# Patient Record
Sex: Male | Born: 1970
Health system: Southern US, Community
[De-identification: ages and names within clinical notes are randomized; demographics above are authoritative.]

## PROBLEM LIST (undated history)

## (undated) DIAGNOSIS — J3489 Other specified disorders of nose and nasal sinuses: Secondary | ICD-10-CM

## (undated) DIAGNOSIS — Z889 Allergy status to unspecified drugs, medicaments and biological substances status: Secondary | ICD-10-CM

## (undated) DIAGNOSIS — K219 Gastro-esophageal reflux disease without esophagitis: Secondary | ICD-10-CM

## (undated) HISTORY — DX: Gastro-esophageal reflux disease without esophagitis: K21.9

---

## 2006-07-18 ENCOUNTER — Emergency Department (HOSPITAL_COMMUNITY): Admission: EM | Admit: 2006-07-18 | Discharge: 2006-07-18 | Payer: Self-pay | Admitting: Emergency Medicine

## 2007-09-22 ENCOUNTER — Emergency Department (HOSPITAL_COMMUNITY): Admission: EM | Admit: 2007-09-22 | Discharge: 2007-09-22 | Payer: Self-pay | Admitting: Emergency Medicine

## 2007-09-22 ENCOUNTER — Encounter: Payer: Self-pay | Admitting: Orthopedic Surgery

## 2007-09-23 ENCOUNTER — Encounter: Payer: Self-pay | Admitting: Orthopedic Surgery

## 2007-09-24 ENCOUNTER — Ambulatory Visit: Payer: Self-pay | Admitting: Orthopedic Surgery

## 2007-09-24 DIAGNOSIS — S93409A Sprain of unspecified ligament of unspecified ankle, initial encounter: Secondary | ICD-10-CM | POA: Insufficient documentation

## 2008-09-10 ENCOUNTER — Observation Stay (HOSPITAL_COMMUNITY): Admission: EM | Admit: 2008-09-10 | Discharge: 2008-09-11 | Payer: Self-pay | Admitting: Emergency Medicine

## 2008-09-11 ENCOUNTER — Encounter (INDEPENDENT_AMBULATORY_CARE_PROVIDER_SITE_OTHER): Payer: Self-pay | Admitting: Family Medicine

## 2010-09-07 LAB — BASIC METABOLIC PANEL
BUN: 11 mg/dL (ref 6–23)
BUN: 13 mg/dL (ref 6–23)
CO2: 23 mEq/L (ref 19–32)
Calcium: 8.8 mg/dL (ref 8.4–10.5)
Chloride: 108 mEq/L (ref 96–112)
Creatinine, Ser: 0.81 mg/dL (ref 0.4–1.5)
GFR calc Af Amer: >60 mL/min (ref >60)
Glucose, Bld: 111 mg/dL — ABNORMAL HIGH (ref 70–99)
Potassium: 3.8 mEq/L (ref 3.5–5.1)

## 2010-09-07 LAB — DIFFERENTIAL
Basophils Absolute: 0.1 10*3/uL (ref 0.0–0.1)
Basophils Relative: 1 % (ref 0–1)
Eosinophils Relative: 1 % (ref 0–5)
Lymphocytes Relative: 22 % (ref 12–46)
Lymphs Abs: 2.2 10*3/uL (ref 0.7–4.0)
Monocytes Absolute: 0.7 10*3/uL (ref 0.1–1.0)
Monocytes Relative: 7 % (ref 3–12)
Neutro Abs: 9.6 10*3/uL — ABNORMAL HIGH (ref 1.7–7.7)
Neutrophils Relative %: 79 % — ABNORMAL HIGH (ref 43–77)

## 2010-09-07 LAB — CARDIAC PANEL(CRET KIN+CKTOT+MB+TROPI)
CK, MB: 0.6 ng/mL (ref 0.3–4.0)
Relative Index: INVALID (ref 0.0–2.5)
Troponin I: 0.03 ng/mL (ref 0.00–0.06)

## 2010-09-07 LAB — CBC
HCT: 38 % — ABNORMAL LOW (ref 39.0–52.0)
Hemoglobin: 13.1 g/dL (ref 13.0–17.0)
MCHC: 34.8 g/dL (ref 30.0–36.0)
Platelets: 315 10*3/uL (ref 150–400)
RBC: 4.15 MIL/uL — ABNORMAL LOW (ref 4.22–5.81)
RDW: 13.4 % (ref 11.5–15.5)
WBC: 10.4 10*3/uL (ref 4.0–10.5)

## 2010-09-07 LAB — TSH: TSH: 2.594 u[IU]/mL (ref 0.350–4.500)

## 2010-09-07 LAB — LIPID PANEL: HDL: 24 mg/dL — ABNORMAL LOW (ref >39)

## 2010-09-07 LAB — POCT CARDIAC MARKERS
CKMB, poc: <1 ng/mL — ABNORMAL LOW (ref 1.0–8.0)
Myoglobin, poc: 56 ng/mL (ref 12–200)

## 2010-09-07 LAB — PROTIME-INR
INR: 1 (ref 0.00–1.49)
Prothrombin Time: 13.2 seconds (ref 11.6–15.2)

## 2010-09-07 LAB — BRAIN NATRIURETIC PEPTIDE: Pro B Natriuretic peptide (BNP): <30 pg/mL (ref 0.0–100.0)

## 2010-09-07 LAB — HOMOCYSTEINE: Homocysteine: 8.4 umol/L (ref 4.0–15.4)

## 2010-10-11 NOTE — H&P (Signed)
Walter Griffin, Walter Griffin              ACCOUNT NO.:  [id number]   MEDICAL RECORD NO.:  [id number]          PATIENT TYPE:  INP   LOCATION:  A317                          FACILITY:  [facility]   PHYSICIAN:  Dorris Singh, DO    DATE OF BIRTH:  [birth year]   DATE OF ADMISSION:  09/10/2008  DATE OF DISCHARGE:  LH                              HISTORY & PHYSICAL   CHIEF COMPLAINT:  Chest pain.   PRIMARY CARE PHYSICIAN:  Madelin Rear. Sherwood Gambler, MD   Mr. Snellgrove is a 40 year old Caucasian male who presented to the [facility] emergency room due to chief complaint of chest pain.  He stated  that he was outside mowing the lawn today and he became diaphoretic.  He  had chest pain and weakness all at once, and shortness of breath that  lasted for about 30 minutes.  He contacted a few of his friends, who  then brought him to the emergency room.  He was given sublingual  nitroglycerin and eventually the pain went away.  This is the first time  he had ever had a situation like this.  He is on metoprolol for  palpitations, but does have an extensive family heart history.  Nothing  made it worse, nitroglycerin made it better.  The patient admits to his pain was an 8/10.  The first nitroglycerin  brought it to 5/10, and the second nitroglycerin brought it down to  0/10.  He did have this a year ago, but the etiology was unknown and the  history of his racing heart etiology was unknown as well.   PAST MEDICAL HISTORY:  Significant for palpitations.   PAST SURGICAL HISTORY:  None.   SOCIAL HISTORY:  The patient is an occasional drinker.  He does use  snuff, but denies any drug use.  The patient is an EMS.   ALLERGIES:  He has no known drug allergies.   MEDICATIONS:  He is currently on metoprolol tartrate, I do not have a  dose.  He was just finishing antibiotics for a sinus infection.   FAMILY HISTORY:  Significant for several cousins and uncles who have had  heart attacks in their 80s and 65s.   REVIEW OF  SYSTEMS:  CONSTITUTIONALLY:  Positive for weakness.  HEAD:  Negative.  EYES:  Negative.  EARS, NOSE, MOUTH AND THROAT:  Negative.  CHEST:  Positive for chest pain.  RESPIRATORY:  Positive for shortness  of breath.  GI:  Negative.  GU:  Negative.  MUSCULOSKELETAL:  Negative.  NEUROLOGIC:  Negative.   PHYSICAL EXAMINATION:  The patient is a 40 year old Caucasian male who  is well-developed, well-nourished and in no acute distress.  HEENT:  Head is normocephalic, atraumatic.  Eyes: PERRL.  EOMI.  NECK:  Supple.  No lymphadenopathy.  HEART:  Regular rate and rhythm.  No murmurs, rubs or gallops.  LUNGS:  Clear to auscultation bilaterally.  No wheezes, rales or  rhonchi.  ABDOMEN:  Soft, nontender.  EXTREMITIES:  Positive pulses.  No edema, ecchymosis or cyanosis.  NEUROLOGIC:  Cranial II-XII are grossly intact.  VITALS:  Heart rate  80, blood pressure 115/63, respirations 15, pulse  oximetry 100%.   X-RAYS:  Chest x-ray is negative.   LABS:  White count 12.3, hemoglobin 13.1, hematocrit 37.8 plate count  161,  neutrophils 79.  Sodium 139, potassium 3.3, chloride 109, carbon  dioxide 23, glucose 93, BUN 13, creatinine 0.81.  Second set cardiac  enzymes were negative.   ASSESSMENT AND PLAN:  1. Chest pain:  Etiology unknown.  2. Hypokalemia.  3. Leukocytosis.   The plan will be to admit the patient to the service of [facility].  We  will place on a monitored bed.  We will consult cardiology.  We will  replace his potassium and will get an EKG in the morning.  We will place  him on his home medications as directed, and do DVT and GI prophylaxis.  We will also monitor his white count to see if that changes.  At this  point in time we do not have a source for infection, other than the  current sinus infection he is being treated for; but will continue to  monitor him and change therapy as necessary.      Dorris Singh, DO  Electronically Signed     CB/MEDQ  D:  09/10/2008  T:   09/10/2008  Job:  [id number]   cc:   Madelin Rear. Sherwood Gambler, MD  Fax: [phone]

## 2010-10-11 NOTE — Discharge Summary (Signed)
Walter Griffin, Walter Griffin              ACCOUNT NO.:  [id number]   MEDICAL RECORD NO.:  [id number]          PATIENT TYPE:  INP   LOCATION:  A317                          FACILITY:  [facility]   PHYSICIAN:  Osvaldo Shipper, MD     DATE OF BIRTH:  1971-03-07   DATE OF ADMISSION:  09/10/2008  DATE OF DISCHARGE:  04/16/2010LH                               DISCHARGE SUMMARY   Please review H and P dictated by Dr. Elige Radon for details regarding  patient's presenting illness.   PRIMARY MEDICAL DOCTOR:  Madelin Rear. Sherwood Gambler, MD.   DISCHARGE DIAGNOSES:  1. Chest pain, resolved with nitroglycerin, requiring outpatient      evaluation.  2. History of palpitations.  3. Family history of heart disease.   HOSPITAL COURSE:  Briefly, this is a 40 year old Caucasian male who  presented to the hospital today and experienced chest pain, was  diaphoretic and the pain radiated to the neck.  The symptoms lasted 30  minutes.  He was given 2 nitroglycerins and the pain went away.  Actually he reports 2 such episodes of pain in the past.  He has had  palpitations in the past.  He has had a stress test 1 year ago done by  Dr. Domingo Sep which was negative.  He has had Holter monitoring, the  results of which I do not know, but she prescribed him metoprolol.  EKG  done here shows some early repolarization, but no acute changes.  EKG  repeated this morning also shows similar findings with no dynamic  changes.  Telemetry does not show any arrhythmias.  He has been pain  free since last night.  His vital signs are all stable.  Blood pressure  is normal.  Heart rate is normal.  His chest x-ray did not show any  active process.  His cardiac enzymes this morning were negative.  We are  awaiting one more at 1 p.m. and if that is negative, he should be able  to go home.  I have discussed this case with Dr. Lewie Loron with [facility] and he feels that if the markers are negative and since the  patient is pain free, he  should be able to go home.  He said he will  arrange followup for this patient early next week.   DISCHARGE MEDICATIONS:  He recommends the following medications:  1. Aspirin 162 mg daily till seen by Dr. Lewie Loron.  2. Nitroglycerin sublingually 0.4 mg q.5 minutes x3 for chest pain as      needed.   1. Otherwise, he may continue his other home medications which include      albuterol inhaler as needed.  2. He is finishing a course of Z-Pak which he may continue.  3. Metoprolol 12.5 mg b.i.d.  4. Flonase nasal spray daily.   DISCHARGE INSTRUCTIONS:  The patient will be explained that if he  experiences chest pain, he needs to take the nitroglycerin and then call  the number provided to him or go to the nearest hospital.   PHYSICAL EXAMINATION:  GENERAL:  Otherwise, the patient feels better  this  morning.  Denies any shortness of breath or chest pain.  VITAL SIGNS:  All stable.  LUNGS:  Clear to auscultation bilaterally.  CARDIOVASCULAR:  S1 and S2 normal, regular.  No murmur is appreciated.  No S3 or S4.  No rubs.  No bruits.  ABDOMEN:  Soft, nontender and nondistended.   FOLLOW UP:  As discussed above.   DIET:  Heart healthy.   PHYSICAL ACTIVITY:  Increase activity slowly.  He will be provided a  note to stay off work till seen by the Cardiologist.   Total time of this encounter greater than 30 minutes.      Osvaldo Shipper, MD  Electronically Signed     GK/MEDQ  D:  09/11/2008  T:  09/11/2008  Job:  [id number]   cc:   Lewie Loron, MD   Madelin Rear. Sherwood Gambler, MD  Fax: [phone]

## 2010-10-28 ENCOUNTER — Emergency Department (HOSPITAL_COMMUNITY)
Admission: EM | Admit: 2010-10-28 | Discharge: 2010-10-28 | Disposition: A | Discharge status: Home / Self Care | Payer: Managed Care, Other (non HMO) | Attending: Emergency Medicine | Admitting: Emergency Medicine

## 2010-10-28 DIAGNOSIS — M545 Low back pain, unspecified: Secondary | ICD-10-CM | POA: Insufficient documentation

## 2010-11-16 ENCOUNTER — Ambulatory Visit (HOSPITAL_COMMUNITY)
Admission: RE | Admit: 2010-11-16 | Discharge: 2010-11-16 | Disposition: A | Discharge status: Home / Self Care | Payer: Managed Care, Other (non HMO) | Source: Ambulatory Visit | Attending: Orthopedic Surgery | Admitting: Orthopedic Surgery

## 2010-11-16 DIAGNOSIS — M545 Low back pain, unspecified: Secondary | ICD-10-CM | POA: Insufficient documentation

## 2010-11-16 DIAGNOSIS — M79609 Pain in unspecified limb: Secondary | ICD-10-CM | POA: Insufficient documentation

## 2010-11-16 DIAGNOSIS — M6281 Muscle weakness (generalized): Secondary | ICD-10-CM | POA: Insufficient documentation

## 2010-11-16 DIAGNOSIS — IMO0001 Reserved for inherently not codable concepts without codable children: Secondary | ICD-10-CM | POA: Insufficient documentation

## 2010-11-18 ENCOUNTER — Ambulatory Visit (HOSPITAL_COMMUNITY)
Admission: RE | Admit: 2010-11-18 | Discharge: 2010-11-18 | Disposition: A | Discharge status: Home / Self Care | Payer: Managed Care, Other (non HMO) | Source: Ambulatory Visit | Attending: Orthopedic Surgery | Admitting: Orthopedic Surgery

## 2010-11-21 ENCOUNTER — Ambulatory Visit (HOSPITAL_COMMUNITY)
Admission: RE | Admit: 2010-11-21 | Discharge: 2010-11-21 | Disposition: A | Discharge status: Home / Self Care | Payer: Managed Care, Other (non HMO) | Source: Ambulatory Visit | Attending: *Deleted | Admitting: *Deleted

## 2010-11-24 ENCOUNTER — Ambulatory Visit (HOSPITAL_COMMUNITY): Payer: Managed Care, Other (non HMO) | Admitting: Physical Therapy

## 2010-12-13 ENCOUNTER — Ambulatory Visit (HOSPITAL_COMMUNITY)
Admission: RE | Admit: 2010-12-13 | Discharge: 2010-12-13 | Disposition: A | Discharge status: Home / Self Care | Payer: Managed Care, Other (non HMO) | Source: Ambulatory Visit | Attending: Orthopedic Surgery | Admitting: Orthopedic Surgery

## 2010-12-13 DIAGNOSIS — IMO0001 Reserved for inherently not codable concepts without codable children: Secondary | ICD-10-CM | POA: Insufficient documentation

## 2010-12-13 DIAGNOSIS — M79609 Pain in unspecified limb: Secondary | ICD-10-CM | POA: Insufficient documentation

## 2010-12-13 DIAGNOSIS — M545 Low back pain, unspecified: Secondary | ICD-10-CM | POA: Insufficient documentation

## 2010-12-13 DIAGNOSIS — M6281 Muscle weakness (generalized): Secondary | ICD-10-CM | POA: Insufficient documentation
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
## 2010-12-15 ENCOUNTER — Ambulatory Visit (HOSPITAL_COMMUNITY)
Admission: RE | Admit: 2010-12-15 | Discharge: 2010-12-15 | Disposition: A | Discharge status: Home / Self Care | Payer: Managed Care, Other (non HMO) | Source: Ambulatory Visit | Attending: Orthopedic Surgery | Admitting: Orthopedic Surgery

## 2010-12-15 NOTE — Patient Instructions (Signed)
Verbal cuing for proper form

## 2010-12-15 NOTE — Progress Notes (Signed)
Physical Therapy Treatment Patient Name: REMBERT BROWE [id number] Date: 12/15/2010  HPI: Symptoms/Limitations Symptoms: increased with work. Pain Assessment Pain Score:   7 Pain Location: Back Pain Orientation: Right;Left Pain Type: Neuropathic pain Pain Radiating Towards: upper left thigh Pain Onset: More than a month ago Pain Frequency: Intermittent  Precautions/Restrictions     Mobility (including Balance)       Exercise/Treatments Cervical Exercises Shoulder Extension: Theraband;10 reps Theraband Level (Shoulder Extension): Level 3 (Green) Row: Theraband Theraband Level (Row): Level 3 (Green) Scapular Retraction: Theraband Theraband Level (Scapular Retraction): Level 3 (Green) Additional Neck Exercises Plank: 5x 15 sec Lumbar Exercises Scapular Retraction: Theraband Theraband Level (Scapular Retraction): Level 3 (Green) Row: Theraband Theraband Level (Row): Level 3 (Green) Shoulder Extension: Theraband;10 reps Theraband Level (Shoulder Extension): Level 3 (Green) Stability Exercises Ab Set:  (sitting on ball LAQ x 5 rep each.) Large Ball Abdominal Isometric: 10 reps;3 seconds Large Ball Oblique Isometric: 10 reps;3 seconds Single Arm Raise: 10 reps;Prone Leg Raise: 10 reps;Prone Opposite Arm/Leg Raise: 10 reps;Prone Plank: 5x 15 sec Wall Slides: 10 reps;5 seconds Lumbar Machine Exercises Tread Mill: 1. x 5 min Additional Hip Exercises Tread Mill: 1. x 5 min Balance Exercises Tread Mill: 1. x 5 min Wall Slides: 10 reps;5 seconds Modalities Modalities: Traction Traction Type of Traction: Lumbar Min (lbs): 50# Max (lbs): 85# Hold Time: 60 Rest Time: 20 Time: 15  Goals PT Short Term Goals Short Term Goal 1 Progress: Met Short Term Goal 2: leg pain if pt sits or stands greater than . Short Term Goal 2 Progress: Progressing toward goal PT Long Term Goals Long Term Goal 1 Progress: Met Long Term Goal 2 Progress: Not met Long Term  Goal 3 Progress: Met End of Session Patient Active Problem List  Diagnoses  . ANKLE SPRAIN, LEFT   PT - End of Session Activity Tolerance: Patient tolerated treatment well General Behavior During Session: [facility] for tasks performed PT Assessment and Plan Clinical Impression Statement: Pt with signs and sx of posterior derangemnt.  Added T-band exercises and sitting ball stab ex. Rehab Potential: Good PT Frequency: Min 2X/week PT Treatment/Interventions: Therapeutic exercise;Other (comment) (traction) PT Plan: give pt. theraband and exercise sheet for home use. Pt needs reassessment next visit. Detria Cummings,CINDY 12/15/2010, 2:49 PM

## 2010-12-21 ENCOUNTER — Ambulatory Visit (HOSPITAL_COMMUNITY)
Admission: RE | Admit: 2010-12-21 | Discharge: 2010-12-21 | Disposition: A | Discharge status: Home / Self Care | Payer: Managed Care, Other (non HMO) | Source: Ambulatory Visit | Attending: Orthopedic Surgery | Admitting: Orthopedic Surgery

## 2010-12-21 NOTE — Progress Notes (Signed)
Physical Therapy Treatment Patient Name: Walter Griffin [id number] Date: 12/21/2010   HPI: Symptoms/Limitations Symptoms: No pain today. Pain Assessment Currently in Pain?: No/denies   Exercise/Treatments Cervical Exercises Shoulder Extension: 15 reps;Theraband Theraband Level (Shoulder Extension): Level 3 (Green) Row: 15 reps;Theraband Theraband Level (Row): Level 3 (Green) Scapular Retraction: 15 reps;Theraband Theraband Level (Scapular Retraction): Level 3 (Green) Additional Neck Exercises Plank: 5x 15 sec Lumbar Exercises Scapular Retraction: 15 reps;Theraband Theraband Level (Scapular Retraction): Level 3 (Green) Row: 15 reps;Theraband Theraband Level (Row): Level 3 (Green) Shoulder Extension: 15 reps;Theraband Theraband Level (Shoulder Extension): Level 3 (Green) Stability Exercises Ab Set:  (with LAQ's on physioball x 10) Large Ball Abdominal Isometric: 10 reps;5 seconds Large Ball Oblique Isometric: 10 reps;5 seconds Single Arm Raise: 10 reps;Prone Leg Raise: 10 reps;Prone Opposite Arm/Leg Raise: 10 reps;Prone Plank: 5x 15 sec Functional Squats: 10 reps Wall Slides: 10 reps;5 seconds Lumbar Machine Exercises Tread Mill: 1. x 5 min Additional Hip Exercises Tread Mill: 1. x 5 min Balance Exercises Tread Mill: 1. x 5 min Wall Slides: 10 reps;5 seconds (Each exercise done once, repeated exercise due to computer error)  Goals PT Short Term Goals Short Term Goal 1 Progress: Met Short Term Goal 2 Progress: Progressing toward goal PT Long Term Goals Long Term Goal 1 Progress: Met Long Term Goal 2 Progress: Progressing toward goal Long Term Goal 3 Progress: Met End of Session Patient Active Problem List  Diagnoses  . ANKLE SPRAIN, LEFT   PT - End of Session Activity Tolerance: Patient tolerated treatment well General Behavior During Session: [facility] for tasks performed Cognition: [facility] for tasks performed PT Assessment and Plan Clinical Impression  Statement: Pt completes theres with increased ease secondary to decreased pain. Mech traction held today secondary to no pain. PT Treatment/Interventions: Therapeutic exercise PT Plan: Continue per PT POC. Address need for mechanical traction next tx.  Seth Bake [facility] 12/21/2010, 4:39 PM

## 2010-12-23 ENCOUNTER — Ambulatory Visit (HOSPITAL_COMMUNITY): Payer: Managed Care, Other (non HMO) | Admitting: *Deleted

## 2010-12-27 ENCOUNTER — Inpatient Hospital Stay (HOSPITAL_COMMUNITY)
Admission: RE | Admit: 2010-12-27 | Payer: Managed Care, Other (non HMO) | Source: Ambulatory Visit | Admitting: Physical Therapy

## 2010-12-30 ENCOUNTER — Ambulatory Visit (HOSPITAL_COMMUNITY): Payer: Managed Care, Other (non HMO)

## 2013-07-30 ENCOUNTER — Emergency Department (HOSPITAL_COMMUNITY): Payer: Worker's Compensation

## 2013-07-30 ENCOUNTER — Emergency Department (HOSPITAL_COMMUNITY)
Admission: EM | Admit: 2013-07-30 | Discharge: 2013-07-30 | Disposition: A | Discharge status: Home / Self Care | Payer: Worker's Compensation | Attending: Emergency Medicine | Admitting: Emergency Medicine

## 2013-07-30 ENCOUNTER — Encounter (HOSPITAL_COMMUNITY): Payer: Self-pay | Admitting: Emergency Medicine

## 2013-07-30 DIAGNOSIS — R0602 Shortness of breath: Secondary | ICD-10-CM | POA: Insufficient documentation

## 2013-07-30 DIAGNOSIS — Y990 Civilian activity done for income or pay: Secondary | ICD-10-CM | POA: Insufficient documentation

## 2013-07-30 DIAGNOSIS — Y9389 Activity, other specified: Secondary | ICD-10-CM | POA: Insufficient documentation

## 2013-07-30 DIAGNOSIS — J705 Respiratory conditions due to smoke inhalation: Secondary | ICD-10-CM | POA: Insufficient documentation

## 2013-07-30 DIAGNOSIS — T5991XA Toxic effect of unspecified gases, fumes and vapors, accidental (unintentional), initial encounter: Secondary | ICD-10-CM | POA: Insufficient documentation

## 2013-07-30 DIAGNOSIS — R42 Dizziness and giddiness: Secondary | ICD-10-CM | POA: Insufficient documentation

## 2013-07-30 DIAGNOSIS — Y9289 Other specified places as the place of occurrence of the external cause: Secondary | ICD-10-CM | POA: Insufficient documentation

## 2013-07-30 DIAGNOSIS — R002 Palpitations: Secondary | ICD-10-CM | POA: Insufficient documentation

## 2013-07-30 DIAGNOSIS — T59891A Toxic effect of other specified gases, fumes and vapors, accidental (unintentional), initial encounter: Secondary | ICD-10-CM | POA: Insufficient documentation

## 2013-07-30 DIAGNOSIS — T59811A Toxic effect of smoke, accidental (unintentional), initial encounter: Secondary | ICD-10-CM

## 2013-07-30 DIAGNOSIS — R0789 Other chest pain: Secondary | ICD-10-CM | POA: Insufficient documentation

## 2013-07-30 HISTORY — DX: Other specified disorders of nose and nasal sinuses: J34.89

## 2013-07-30 LAB — TROPONIN I
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
## 2015-09-02 ENCOUNTER — Encounter (HOSPITAL_COMMUNITY)
Admission: EM | Disposition: A | Discharge status: Home / Self Care | Payer: Self-pay | Source: Ambulatory Visit | Attending: Cardiology

## 2015-09-02 ENCOUNTER — Encounter (HOSPITAL_COMMUNITY): Payer: Self-pay | Admitting: Cardiology

## 2015-09-02 ENCOUNTER — Observation Stay (HOSPITAL_COMMUNITY)
Admission: EM | Admit: 2015-09-02 | Discharge: 2015-09-03 | Disposition: A | Discharge status: Home / Self Care | Payer: BLUE CROSS/BLUE SHIELD | Source: Ambulatory Visit | Attending: Cardiology | Admitting: Cardiology

## 2015-09-02 DIAGNOSIS — I213 ST elevation (STEMI) myocardial infarction of unspecified site: Secondary | ICD-10-CM | POA: Diagnosis present

## 2015-09-02 DIAGNOSIS — R079 Chest pain, unspecified: Secondary | ICD-10-CM | POA: Diagnosis present

## 2015-09-02 DIAGNOSIS — R0602 Shortness of breath: Secondary | ICD-10-CM | POA: Insufficient documentation

## 2015-09-02 DIAGNOSIS — I25119 Atherosclerotic heart disease of native coronary artery with unspecified angina pectoris: Secondary | ICD-10-CM

## 2015-09-02 DIAGNOSIS — I959 Hypotension, unspecified: Secondary | ICD-10-CM | POA: Diagnosis not present

## 2015-09-02 DIAGNOSIS — I2109 ST elevation (STEMI) myocardial infarction involving other coronary artery of anterior wall: Secondary | ICD-10-CM | POA: Insufficient documentation

## 2015-09-02 DIAGNOSIS — Z23 Encounter for immunization: Secondary | ICD-10-CM | POA: Diagnosis not present

## 2015-09-02 DIAGNOSIS — R61 Generalized hyperhidrosis: Secondary | ICD-10-CM | POA: Insufficient documentation

## 2015-09-02 DIAGNOSIS — I249 Acute ischemic heart disease, unspecified: Secondary | ICD-10-CM | POA: Diagnosis present

## 2015-09-02 DIAGNOSIS — R0789 Other chest pain: Secondary | ICD-10-CM

## 2015-09-02 HISTORY — DX: Allergy status to unspecified drugs, medicaments and biological substances: Z88.9

## 2015-09-02 HISTORY — PX: CARDIAC CATHETERIZATION: SHX172

## 2015-09-02 LAB — CBC
HEMATOCRIT: 40.4 % (ref 39.0–52.0)
HEMOGLOBIN: 13.1 g/dL (ref 13.0–17.0)
MCH: 29 pg (ref 26.0–34.0)
MCHC: 32.4 g/dL (ref 30.0–36.0)
MCV: 89.4 fL (ref 78.0–100.0)
Platelets: 321 10*3/uL (ref 150–400)
RBC: 4.52 MIL/uL (ref 4.22–5.81)
RDW: 13.5 % (ref 11.5–15.5)
WBC: 13.6 10*3/uL — ABNORMAL HIGH (ref 4.0–10.5)

## 2015-09-02 LAB — POCT I-STAT, CHEM 8
BUN: 21 mg/dL — ABNORMAL HIGH (ref 6–20)
Calcium, Ion: 1.15 mmol/L (ref 1.12–1.23)
Chloride: 107 mmol/L (ref 101–111)
Creatinine, Ser: 0.8 mg/dL (ref 0.61–1.24)
Glucose, Bld: 103 mg/dL — ABNORMAL HIGH (ref 65–99)
HEMATOCRIT: 42 % (ref 39.0–52.0)
HEMOGLOBIN: 14.3 g/dL (ref 13.0–17.0)
Potassium: 3.8 mmol/L (ref 3.5–5.1)
SODIUM: 143 mmol/L (ref 135–145)
TCO2: 22 mmol/L (ref 0–100)

## 2015-09-02 LAB — COMPREHENSIVE METABOLIC PANEL
ALT: 21 U/L (ref 17–63)
AST: 16 U/L (ref 15–41)
Albumin: 3.5 g/dL (ref 3.5–5.0)
Alkaline Phosphatase: 67 U/L (ref 38–126)
Anion gap: 10 (ref 5–15)
BILIRUBIN TOTAL: 0.7 mg/dL (ref 0.3–1.2)
BUN: 18 mg/dL (ref 6–20)
CHLORIDE: 108 mmol/L (ref 101–111)
CO2: 23 mmol/L (ref 22–32)
CREATININE: 0.85 mg/dL (ref 0.61–1.24)
Calcium: 8.3 mg/dL — ABNORMAL LOW (ref 8.9–10.3)
GFR calc Af Amer: >60 mL/min (ref >60)
GLUCOSE: 105 mg/dL — AB (ref 65–99)
Potassium: 3.8 mmol/L (ref 3.5–5.1)
Sodium: 141 mmol/L (ref 135–145)
TOTAL PROTEIN: 6.1 g/dL — AB (ref 6.5–8.1)

## 2015-09-02 LAB — APTT: aPTT: 29 seconds (ref 24–37)

## 2015-09-02 LAB — LIPID PANEL
CHOL/HDL RATIO: 5.6 ratio
CHOLESTEROL: 150 mg/dL (ref 0–200)
HDL: 27 mg/dL — ABNORMAL LOW (ref >40)
LDL Cholesterol: 94 mg/dL (ref 0–99)
Triglycerides: 145 mg/dL (ref <150)
VLDL: 29 mg/dL (ref 0–40)

## 2015-09-02 LAB — DIFFERENTIAL
Basophils Absolute: 0 10*3/uL (ref 0.0–0.1)
Basophils Relative: 0 %
EOS ABS: 0 10*3/uL (ref 0.0–0.7)
EOS PCT: 0 %
LYMPHS ABS: 1.5 10*3/uL (ref 0.7–4.0)
Lymphocytes Relative: 11 %
MONOS PCT: 8 %
Monocytes Absolute: 1.1 10*3/uL — ABNORMAL HIGH (ref 0.1–1.0)
Neutro Abs: 10.9 10*3/uL — ABNORMAL HIGH (ref 1.7–7.7)
Neutrophils Relative %: 81 %

## 2015-09-02 LAB — TROPONIN I: Troponin I: <0.03 ng/mL (ref <0.031)

## 2015-09-02 LAB — D-DIMER, QUANTITATIVE (NOT AT ARMC)

## 2015-09-02 LAB — PROTIME-INR
INR: 1.1 (ref 0.00–1.49)
Prothrombin Time: 14.4 seconds (ref 11.6–15.2)

## 2015-09-02 LAB — SEDIMENTATION RATE: Sed Rate: 0 mm/hr (ref 0–16)

## 2015-09-02 LAB — C-REACTIVE PROTEIN

## 2015-09-02 SURGERY — LEFT HEART CATH AND CORONARY ANGIOGRAPHY

## 2015-09-02 MED ORDER — SODIUM CHLORIDE 0.9 % WEIGHT BASED INFUSION
3.0000 mL/kg/h | INTRAVENOUS | Status: AC
  Administered 2015-09-02: 3 mL/kg/h via INTRAVENOUS

## 2015-09-02 MED ORDER — FENTANYL CITRATE (PF) 100 MCG/2ML IJ SOLN
INTRAMUSCULAR | Status: AC
  Filled 2015-09-02: qty 2

## 2015-09-02 MED ORDER — MIDAZOLAM HCL 2 MG/2ML IJ SOLN
INTRAMUSCULAR | Status: AC
  Filled 2015-09-02: qty 2

## 2015-09-02 MED ORDER — HEPARIN (PORCINE) IN NACL 2-0.9 UNIT/ML-% IJ SOLN
INTRAMUSCULAR | Status: AC
  Filled 2015-09-02: qty 1000

## 2015-09-02 MED ORDER — PANTOPRAZOLE SODIUM 40 MG PO TBEC
40.0000 mg | DELAYED_RELEASE_TABLET | Freq: Two times a day (BID) | ORAL | Status: DC
  Administered 2015-09-02 – 2015-09-03 (×3): 40 mg via ORAL
  Filled 2015-09-02 (×3): qty 1

## 2015-09-02 MED ORDER — ACETAMINOPHEN 325 MG PO TABS
650.0000 mg | ORAL_TABLET | ORAL | Status: DC | PRN
  Administered 2015-09-02: 13:00:00 650 mg via ORAL
  Filled 2015-09-02: qty 2

## 2015-09-02 MED ORDER — VERAPAMIL HCL 2.5 MG/ML IV SOLN
INTRAVENOUS | Status: DC | PRN
  Administered 2015-09-02: 10 mL via INTRA_ARTERIAL

## 2015-09-02 MED ORDER — SODIUM CHLORIDE 0.9 % IV SOLN
INTRAVENOUS | Status: DC | PRN
  Administered 2015-09-02: 100 mL/h via INTRAVENOUS

## 2015-09-02 MED ORDER — SODIUM CHLORIDE 0.9% FLUSH
3.0000 mL | Freq: Two times a day (BID) | INTRAVENOUS | Status: DC
  Administered 2015-09-02 (×2): 3 mL via INTRAVENOUS

## 2015-09-02 MED ORDER — ATORVASTATIN CALCIUM 40 MG PO TABS
40.0000 mg | ORAL_TABLET | Freq: Every day | ORAL | Status: DC
  Administered 2015-09-02: 40 mg via ORAL
  Filled 2015-09-02: qty 1

## 2015-09-02 MED ORDER — MORPHINE SULFATE (PF) 2 MG/ML IV SOLN: 2.0000 mg | INTRAVENOUS | Status: DC | PRN

## 2015-09-02 MED ORDER — LIDOCAINE HCL (PF) 1 % IJ SOLN
INTRAMUSCULAR | Status: AC
  Filled 2015-09-02: qty 30

## 2015-09-02 MED ORDER — SODIUM CHLORIDE 0.9% FLUSH: 3.0000 mL | INTRAVENOUS | Status: DC | PRN

## 2015-09-02 MED ORDER — ONDANSETRON HCL 4 MG/2ML IJ SOLN: 4.0000 mg | Freq: Four times a day (QID) | INTRAMUSCULAR | Status: DC | PRN

## 2015-09-02 MED ORDER — PNEUMOCOCCAL VAC POLYVALENT 25 MCG/0.5ML IJ INJ
0.5000 mL | INJECTION | INTRAMUSCULAR | Status: AC
  Administered 2015-09-03: 0.5 mL via INTRAMUSCULAR
  Filled 2015-09-02: qty 0.5

## 2015-09-02 MED ORDER — ZOLPIDEM TARTRATE 5 MG PO TABS: 5.0000 mg | ORAL_TABLET | Freq: Every evening | ORAL | Status: DC | PRN

## 2015-09-02 MED ORDER — ASPIRIN 81 MG PO CHEW
81.0000 mg | CHEWABLE_TABLET | Freq: Every day | ORAL | Status: DC
  Administered 2015-09-03: 10:00:00 81 mg via ORAL
  Filled 2015-09-02: qty 1

## 2015-09-02 MED ORDER — IOPAMIDOL (ISOVUE-370) INJECTION 76%
INTRAVENOUS | Status: DC | PRN
  Administered 2015-09-02: 95 mL via INTRA_ARTERIAL

## 2015-09-02 MED ORDER — IOPAMIDOL (ISOVUE-370) INJECTION 76%
INTRAVENOUS | Status: AC
  Filled 2015-09-02: qty 125

## 2015-09-02 MED ORDER — MIDAZOLAM HCL 2 MG/2ML IJ SOLN
INTRAMUSCULAR | Status: DC | PRN
  Administered 2015-09-02: 1 mg via INTRAVENOUS

## 2015-09-02 MED ORDER — HEPARIN SODIUM (PORCINE) 1000 UNIT/ML IJ SOLN
INTRAMUSCULAR | Status: DC | PRN
  Administered 2015-09-02: 4000 [IU] via INTRAVENOUS

## 2015-09-02 MED ORDER — ALPRAZOLAM 0.25 MG PO TABS: 0.2500 mg | ORAL_TABLET | Freq: Two times a day (BID) | ORAL | Status: DC | PRN

## 2015-09-02 MED ORDER — VERAPAMIL HCL 2.5 MG/ML IV SOLN
INTRAVENOUS | Status: AC
  Filled 2015-09-02: qty 2

## 2015-09-02 MED ORDER — TRAMADOL HCL 50 MG PO TABS: 50.0000 mg | ORAL_TABLET | Freq: Four times a day (QID) | ORAL | Status: DC | PRN

## 2015-09-02 MED ORDER — FENTANYL CITRATE (PF) 100 MCG/2ML IJ SOLN
INTRAMUSCULAR | Status: DC | PRN
  Administered 2015-09-02: 50 ug via INTRAVENOUS

## 2015-09-02 MED ORDER — SODIUM CHLORIDE 0.9 % IV SOLN: 250.0000 mL | INTRAVENOUS | Status: DC | PRN

## 2015-09-02 MED ORDER — LIDOCAINE HCL (PF) 1 % IJ SOLN
INTRAMUSCULAR | Status: DC | PRN
  Administered 2015-09-02: 09:00:00

## 2015-09-02 SURGICAL SUPPLY — 11 items
CATH INFINITI 5FR ANG PIGTAIL (CATHETERS) ×1 IMPLANT
CATH INFINITI JR4 5F (CATHETERS) ×2 IMPLANT
CATH VISTA GUIDE 6FR XBLAD3.5 (CATHETERS) ×1 IMPLANT
DEVICE RAD COMP TR BAND LRG (VASCULAR PRODUCTS) ×1 IMPLANT
GLIDESHEATH SLEND A-KIT 6F 22G (SHEATH) ×1 IMPLANT
KIT HEART LEFT (KITS) ×2 IMPLANT
PACK CARDIAC CATHETERIZATION (CUSTOM PROCEDURE TRAY) ×2 IMPLANT
SYR MEDRAD MARK V 150ML (SYRINGE) ×2 IMPLANT
TRANSDUCER W/STOPCOCK (MISCELLANEOUS) ×2 IMPLANT
TUBING CIL FLEX 10 FLL-RA (TUBING) ×2 IMPLANT
WIRE SAFE-T 1.5MM-J .035X260CM (WIRE) ×1 IMPLANT

## 2015-09-02 NOTE — H&P (Signed)
Patient ID: Walter Griffin MRN: [id number], DOB/AGE: 46/05/1970   Admit date: 09/02/2015  Requesting Physician: [facility]  Primary Physician: No PCP Per Patient Primary Cardiologist: None  Reason for admission: STEMI  Pt. Profile:  Walter Griffin is a 45 y.o. male with a non contributory PMH who presented to [facility] today via EMS as a code STEMI.   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 He does not smoke cigarettes but does chew tobacco. He was in his usual state of health until this morning when he was at work and he developed substernal chest pain associated with shortness of breath, diaphoresis and nausea. His coworkers put him in an ambulance and took him to [facility] however in route EKG revealed ST elevations in V1 through V3 and it was decided to [facility]. He was given 1 SL NTG with partial relief. However,, he became hypotensive. When he arrived to [facility] he was brought emergently to the Cath Lab for coronary angiography and possible PCI.  Problem List  Past Medical History  Diagnosis Date  . Sinus drainage   . H/O seasonal allergies     Past Surgical History  Procedure Laterality Date  . Cardiac catheterization N/A 09/02/2015    Procedure: Left Heart Cath and Coronary Angiography;  Surgeon: Marykay Lexavid W Harding, MD;  [facility];  Service: Cardiovascular;  Laterality: N/A;     Allergies  No Known Allergies   Home Medications  Prior to Admission medications   Not on File    Family History  History reviewed. No pertinent family history. No family status information on file.     Social History  Social History   Social History  . Marital Status: Married    Spouse Name: N/A  . Number of Children: N/A  . Years of Education: N/A   Occupational History  . Not on file.   Social History Main Topics  . Smoking  status: Never Smoker   . Smokeless tobacco: Current User  . Alcohol Use: Yes     Comment: SOCIAL  . Drug Use: No  . Sexual Activity: Not on file   Other Topics Concern  . Not on file   Social History Narrative     All other systems reviewed and are otherwise negative except as noted above.  Physical Exam  Blood pressure 136/82, pulse 89, temperature 99 F (37.2 C), temperature source Oral, resp. rate 15, height 6\' 1"  (1.854 m), weight 185 lb 12.8 oz (84.278 kg), SpO2 96 %.  General: Pleasant, NAD Psych: Normal affect. Neuro: Alert and oriented X 3. Moves all extremities spontaneously. HEENT: Normal  Neck: Supple without bruits or JVD. Lungs:  Resp regular and unlabored, CTA. Heart: RRR no s3, s4, or murmurs. Abdomen: Soft, non-tender, non-distended, BS + x 4.  Extremities: No clubbing, cyanosis or edema. DP/PT/Radials 2+ and equal bilaterally.  Labs   Recent Labs  09/02/15 0921 09/02/15 1035  TROPONINI <0.03 <0.03   Lab Results  Component Value Date   WBC 13.6* 09/02/2015   HGB 13.1 09/02/2015   HCT 40.4 09/02/2015   MCV 89.4 09/02/2015   PLT 321 09/02/2015    Recent Labs Lab 09/02/15 0921  NA 141  K 3.8  CL 108  CO2 23  BUN 18  CREATININE 0.85  CALCIUM 8.3*  PROT 6.1*  BILITOT 0.7  ALKPHOS 67  ALT 21  AST 16  GLUCOSE 105*   Lab Results  Component Value Date   CHOL 150 09/02/2015   HDL 27* 09/02/2015   LDLCALC 94 09/02/2015   TRIG 145 09/02/2015   Lab Results  Component Value Date   DDIMER <0.27 09/02/2015     Radiology/Studies:  LHC 09/02/15 Conclusion    1. Ost LAD lesion, 35% stenosed. 2. The left ventricular systolic function is normal. 3. Normal LVEDP Unclear etiology for the patient's chest pain and mild subtle EKG changes. But clearly no obstructive CAD noted on angiography. Plan:  Admit overnight in telemetry unit to follow-up echocardiogram to exclude possible pericarditis as etiology.  Consider treating for GERD Would  expect the patient will be stable for discharge tomorrow if chest pain free. Could consider outpatient evaluation for possible GI etiology.     ECG  NSR with slight STE in V1-V3  ASSESSMENT AND PLAN  Walter Griffin is a 45 y.o. male with a non contributory PMH who presented to [facility] today via EMS as a code STEMI.   He was taken back for urgent coronary angiography which revealed an ostial LAD lesion that was 35% stenosed; otherwise no obstructive CAD, normal LV systolic function and normal LVEDP. It was decided to admit him overnight for an echocardiogram and continued monitoring to rule out pericarditis as the etiology of his chest pain. So far, all inflammatory markers have returned negative. D-dimer negative. Troponin negative 2. Await echocardiography and if normal he can be discharged home tomorrow. Will start empiric PPI for possible acid reflux.  Billy Fischer, PA-C 09/02/2015, 1:28 PM  Pager [phone]

## 2015-09-02 NOTE — Progress Notes (Signed)
TR BAND REMOVAL  LOCATION:    right radial  DEFLATED PER PROTOCOL:    Yes.    TIME BAND OFF / DRESSING APPLIED:    1330   SITE UPON ARRIVAL:    Level 0  SITE AFTER BAND REMOVAL:    Level 0  CIRCULATION SENSATION AND MOVEMENT:    Within Normal Limits   Yes.    COMMENTS:   Drsg cdi, site stable.

## 2015-09-02 NOTE — Hospital Discharge Follow-Up (Signed)
This Case Manager received call from Letha Capeeborah Taylor, RN CM that patient needing hospital follow-up appointment. Patient does not have a PCP. Appointment scheduled for 09/06/15 at 1700 with Dr. Julien NordmannLangeland.  AVS updated. Letha Capeeborah Taylor, RN CM updated.

## 2015-09-03 ENCOUNTER — Observation Stay (HOSPITAL_COMMUNITY): Payer: BLUE CROSS/BLUE SHIELD

## 2015-09-03 DIAGNOSIS — R079 Chest pain, unspecified: Secondary | ICD-10-CM | POA: Diagnosis not present

## 2015-09-03 DIAGNOSIS — R0789 Other chest pain: Secondary | ICD-10-CM

## 2015-09-03 LAB — BASIC METABOLIC PANEL
ANION GAP: 9 (ref 5–15)
BUN: 10 mg/dL (ref 6–20)
CALCIUM: 8.7 mg/dL — AB (ref 8.9–10.3)
CO2: 23 mmol/L (ref 22–32)
Chloride: 106 mmol/L (ref 101–111)
Creatinine, Ser: 0.95 mg/dL (ref 0.61–1.24)
Glucose, Bld: 117 mg/dL — ABNORMAL HIGH (ref 65–99)
POTASSIUM: 3.8 mmol/L (ref 3.5–5.1)
SODIUM: 138 mmol/L (ref 135–145)

## 2015-09-03 LAB — LIPID PANEL
CHOL/HDL RATIO: 5.1 ratio
CHOLESTEROL: 147 mg/dL (ref 0–200)
HDL: 29 mg/dL — ABNORMAL LOW (ref >40)
LDL Cholesterol: 84 mg/dL (ref 0–99)
TRIGLYCERIDES: 172 mg/dL — AB (ref <150)
VLDL: 34 mg/dL (ref 0–40)

## 2015-09-03 LAB — CBC
HEMATOCRIT: 41.9 % (ref 39.0–52.0)
HEMOGLOBIN: 13.4 g/dL (ref 13.0–17.0)
MCH: 29 pg (ref 26.0–34.0)
MCHC: 32 g/dL (ref 30.0–36.0)
MCV: 90.7 fL (ref 78.0–100.0)
Platelets: 310 10*3/uL (ref 150–400)
RBC: 4.62 MIL/uL (ref 4.22–5.81)
RDW: 13.7 % (ref 11.5–15.5)
WBC: 11.1 10*3/uL — AB (ref 4.0–10.5)

## 2015-09-03 LAB — TSH: TSH: 0.728 u[IU]/mL (ref 0.350–4.500)

## 2015-09-03 MED ORDER — LIVING WELL WITH DIABETES BOOK
Freq: Once | Status: AC
  Administered 2015-09-03: 11:00:00
  Filled 2015-09-03: qty 1

## 2015-09-03 MED ORDER — PANTOPRAZOLE SODIUM 40 MG PO TBEC: 40.0000 mg | DELAYED_RELEASE_TABLET | Freq: Two times a day (BID) | ORAL | Status: DC

## 2015-09-03 MED FILL — PANTOPRAZOLE SOD DR 40 MG T: 40 | 30 days supply | Qty: 60 | Fill #0

## 2015-09-03 NOTE — Discharge Instructions (Signed)

## 2015-09-03 NOTE — Discharge Summary (Signed)
Discharge Summary    Patient ID: Walter Griffin,  MRN: [id number], DOB/AGE: May 16, 1971 45 y.o.  Admit date: 09/02/2015 Discharge date: 09/03/2015  Primary Care Provider: No PCP Per Patient Primary Cardiologist: Dr Ellyn Hack  Discharge Diagnoses    Principal Problem:   Chest pain of uncertain etiology - STEMI excluded Active Problems:   Acute coronary syndrome (HCC)   Allergies No Known Allergies  Diagnostic Studies/Procedures    1. Ost LAD lesion, 35% stenosed. 2. The left ventricular systolic function is normal. 3. Normal LVEDP Unclear etiology for the patient's chest pain and mild subtle EKG changes. But clearly no obstructive CAD noted on angiography. 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 Developed SSCP associated with SOB, nausea and diaphoresis on the day of admission. ECG was possible STEMI so he was transported to [facility] and taken to the cath lab.   Hospital Course     Consultants: none   Cardiac cath results are above. He had no significant CAD and his EF was normal. ESR and CRP were checked to evaluate him for possible pericarditis, but were normal. D-dimer was negative as well. Cardiac enzymes remained negative, arguing against spasm.  His symptoms were concerning for GERD, so he was started on Protonix. His symptoms resolved and did not return. His lipid profile was reviewed and his HDL was low, triglycerides mildly elevated. Blood sugars were mildly elevated as well. He will be asked to limit simple carbohydrates and sugars. He had mild leukocytosis, but no fever or other signs of infection.   On 04/07, he was seen by Dr Martinique and all data were reviewed. No further workup was indicated and he is considered stable for discharge, to follow up as an outpatient. _____________  Discharge Vitals Blood pressure 138/84, pulse 88, temperature 98.7 F (37.1 C),  temperature source Oral, resp. rate 18, height 6' 1"  (1.854 m), weight 189 lb 2.5 oz (85.8 kg), SpO2 96 %.  Filed Weights   09/02/15 1135 09/03/15 0359  Weight: 185 lb 12.8 oz (84.278 kg) 189 lb 2.5 oz (85.8 kg)  General: Well developed, well nourished, male in no acute distress Head: Eyes PERRLA, No xanthomas.   Normocephalic and atraumatic  Lungs: Clear bilaterally to auscultation. Heart: HRRR S1 S2, without MRG.  Pulses are 2+ & equal. No JVD. Abdomen: Bowel sounds are present, abdomen soft and non-tender without masses or  hernias noted. Msk: Normal strength and tone for age. Extremities: No clubbing, cyanosis or edema.  R radial cath site without ecchymosis or hematoma  Skin:  No rashes or lesions noted. Neuro: Alert and oriented X 3. Psych:  Good affect, responds appropriately   Labs & Radiologic Studies    CBC  Recent Labs  09/02/15 0921 09/03/15 0400  WBC 13.6* 11.1*  NEUTROABS 10.9*  --   HGB 13.1 13.4  HCT 40.4 41.9  MCV 89.4 90.7  PLT 321 109   Basic Metabolic Panel  Recent Labs  09/02/15 0921 09/03/15 0400  NA 141 138  K 3.8 3.8  CL 108 106  CO2 23 23  GLUCOSE 105* 117*  BUN 18 10  CREATININE 0.85 0.95  CALCIUM 8.3* 8.7*   Liver Function Tests  Recent Labs  09/02/15 0921  AST 16  ALT 21  ALKPHOS 67  BILITOT 0.7  PROT 6.1*  ALBUMIN 3.5  Cardiac Enzymes Lab Results  Component Value Date   CKTOTAL 75 09/11/2008   CKMB 0.8 09/11/2008   TROPONINI <0.03 09/02/2015    Recent Labs  09/02/15 1035 09/02/15 1621 09/02/15 2223  TROPONINI <0.03 <0.03 <0.03   D-Dimer  Recent Labs  09/02/15 0921  DDIMER <0.27   Fasting Lipid Panel  Recent Labs  09/03/15 0400  CHOL 147  HDL 29*  LDLCALC 84  TRIG 172*  CHOLHDL 5.1   Thyroid Function Tests  Recent Labs  09/03/15 0400  TSH 0.728   Lab Results  Component Value Date   ESRSEDRATE 0 09/02/2015   Lab Results  Component Value Date   CRP <0.5 09/02/2015   _____________    Disposition   Pt is being discharged home today in good condition.  Follow-up Plans & Appointments    Follow-up Information    Follow up with [place] On 09/06/2015.   Why:  Hospital follow-up appointment on 09/06/15 at 5:00 pm with Dr. Janne Napoleon.   Contact information:   [address] [place] [address] [phone]      Follow up with Leonie Man, MD On 09/16/2015.   Specialty:  Cardiology   Why:  See MD at 3:45 pm, please arrive 15 minutes early for paperwork.   Contact information:   [address] [place] [address] [phone]      Discharge Instructions    Diet - low sodium heart healthy    Complete by:  As directed      Increase activity slowly    Complete by:  As directed            Discharge Medications   Current Discharge Medication List    START taking these medications   Details  pantoprazole (PROTONIX) 40 MG tablet Take 1 tablet (40 mg total) by mouth 2 (two) times daily. Qty: 60 tablet, Refills: 3      CONTINUE these medications which have NOT CHANGED   Details  loratadine (CLARITIN) 10 MG tablet Take 10 mg by mouth daily as needed for allergies.          Outstanding Labs/Studies   None  Duration of Discharge Encounter   Greater than 30 minutes including physician time.  Jonetta Speak NP 09/03/2015, 8:45 AM  Patient seen and examined and history reviewed. Agree with above findings and plan. Patient is without throat or chest pain. Presented with predominant symptoms of throat tightness. Ecg shows mild repolarization abnormality. Cardiac enzymes are all normal. Cath is benign. Nothing on history or physical exam suggests pericarditis. Inflammatory markers are normal. Will cancel Echo. Trial of PPI. Home today. May return to work next Tuesday.  Norrine Ballester Martinique, [place] 09/03/2015 10:28 AM

## 2015-09-03 NOTE — Care Management Note (Signed)
Case Management Note  Patient Details  Name: Walter Griffin MRN: [id number] Date of Birth: 11/17/1970  Subjective/Objective:     Patient is for discharge today, patient has [employer] insurance, NCM made copy of insurance card will fax to  admissions.   No needs.            Action/Plan:   Expected Discharge Date:                  Expected Discharge Plan:  Home/Self Care  In-House Referral:     Discharge planning Services  CM Consult  Post Acute Care Choice:    Choice offered to:     DME Arranged:    DME Agency:     HH Arranged:    HH Agency:     Status of Service:  Completed, signed off  Medicare Important Message Given:    Date Medicare IM Given:    Medicare IM give by:    Date Additional Medicare IM Given:    Additional Medicare Important Message give by:     If discussed at Long Length of Stay Meetings, dates discussed:    Additional Comments:  Leone Havenaylor, Rc Amison Clinton, RN 09/03/2015, 10:42 AM

## 2015-09-06 ENCOUNTER — Encounter: Payer: Self-pay | Admitting: Physician Assistant

## 2015-09-06 ENCOUNTER — Ambulatory Visit: Payer: BLUE CROSS/BLUE SHIELD | Attending: Internal Medicine | Admitting: Physician Assistant

## 2015-09-06 VITALS — BP 129/80 | HR 91 | Temp 98.1°F | Resp 18 | Ht 73.0 in | Wt 192.8 lb

## 2015-09-06 DIAGNOSIS — Z79899 Other long term (current) drug therapy: Secondary | ICD-10-CM | POA: Insufficient documentation

## 2015-09-06 DIAGNOSIS — R7302 Impaired glucose tolerance (oral): Secondary | ICD-10-CM | POA: Diagnosis not present

## 2015-09-06 DIAGNOSIS — I249 Acute ischemic heart disease, unspecified: Secondary | ICD-10-CM | POA: Diagnosis not present

## 2015-09-06 DIAGNOSIS — R7309 Other abnormal glucose: Secondary | ICD-10-CM

## 2015-09-06 LAB — GLUCOSE, POCT (MANUAL RESULT ENTRY): POC Glucose: 114 mg/dl — AB (ref 70–99)

## 2015-09-06 LAB — POCT GLYCOSYLATED HEMOGLOBIN (HGB A1C): HEMOGLOBIN A1C: 5.7

## 2015-09-06 NOTE — Progress Notes (Signed)
Patient ID: Walter Griffin, male   DOB: 1970/12/29, 45 y.o.   MRN: [id number]    Venancio Chenier, is a 45 y.o. male  WUJ:[id number]  [id number]  DOB - [birth year]  Chief Complaint  Patient presents with  . Hospitalization Follow-up        Subjective:  Chief Complaint and HPI: Walter Griffin is a 45 y.o. male here today to establish care and for a follow up visit from being seen in the ED.  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  His enzymes came back negative and his heart cath was negative for STEMI.  Cath did reveal:  Ost LAD lesion, 35% stenosed, left ventricular systolic function was normal with  Normal LVEDP.  He was kept overnight for observation.  His CP was ruled non-cardiac.  Non-smoker. Glucose elevated in the hospital.  Denies FH early cardiac events.  No CP since ED/hospital stay.  Protonix was started and seems to be helping.   ED/Hospital notes reviewed.    ROS:   Constitutional:  No f/c, No night sweats, No unexplained weight loss. EENT:  No vision changes, No blurry vision, No hearing changes. No mouth, throat, or ear problems.  Respiratory: No cough, No SOB Cardiac: No CP, no palpitations GI:  No abd pain, No N/V/D. GU: No Urinary s/sx Musculoskeletal: No joint pain Neuro: No headache, no dizziness, no motor weakness.  Skin: No rash Endocrine:  No polydipsia. No polyuria.  Psych: Denies SI/HI  ALLERGIES: No Known Allergies  PAST MEDICAL HISTORY: Past Medical History  Diagnosis Date  . Sinus drainage   . H/O seasonal allergies     MEDICATIONS AT HOME: Prior to Admission medications   Medication Sig Start Date End Date Taking? Authorizing Provider  loratadine (CLARITIN) 10 MG tablet Take 10 mg by mouth daily as needed for allergies.   Yes Historical Provider, MD  pantoprazole (PROTONIX) 40 MG tablet Take 1 tablet (40 mg  total) by mouth 2 (two) times daily. 09/03/15  Yes Rhonda G Barrett, PA-C     Objective:  EXAM:   Filed Vitals:   09/06/15 1655  BP: 129/80  Pulse: 91  Temp: 98.1 F (36.7 C)  TempSrc: Oral  Resp: 18  Height:  (1.854 m)  Weight: 192 lb 12.8 oz (87.454 kg)  SpO2: 97%    General appearance : A&OX3. NAD. Non-toxic-appearing HEENT: Atraumatic and Normocephalic.  PERRLA. EOM intact. Poor dentition. Mouth-MMM, post pharynx WNL w/o erythema, No PND. Neck: supple, no JVD. No cervical lymphadenopathy. No thyromegaly Chest/Lungs:  Breathing-non-labored, Good air entry bilaterally, breath sounds normal without rales, rhonchi, or wheezing  CVS: S1 S2 regular, no murmurs, gallops, rubs d. Extremities: Bilateral Lower Ext shows no edema, both legs are warm to touch with = pulse throughout Neurology:  CN II-XII grossly intact, Non focal.   Psych:  TP linear. J/I WNL. Normal speech. Appropriate eye contact and affect.  Skin:  No Rash  Data Review Lab Results  Component Value Date   HGBA1C 5.7 09/06/2015     Assessment & Plan   1. Acute coronary syndrome (HCC) STEMI was R/o.  He has f/up appt with cardiology on 09/16/2015.  He is pain free since hospitalization and starting Protonix.  2. Other abnormal glucose - POCT A1C-5.7 - Glucose (CBG) I have had a lengthy discussion and  provided education about insulin resistance and the intake of too much sugar/refined carbohydrates.  I have advised the patient to work at a goal of eliminating sugary drinks, candy, desserts, sweets, refined sugars, processed foods, and white carbohydrates.  The patient expresses understanding.   Patient have been counseled extensively about nutrition and exercise  Return in about 3 months (around 12/06/2015) for continuity of care. ; sooner if needed.   The patient was given clear instructions to go to ER or return to medical center if symptoms don't improve, worsen or new problems develop. The patient  verbalized understanding. The patient was told to call to get lab results if they haven't heard anything in the next week.   Georgian CoAngela Darwin Rothlisberger, PA-C [facility] [place], [place] [phone]   09/06/2015, 5:05 PM

## 2015-09-06 NOTE — Progress Notes (Signed)
Patient is here for HFU  Patient denies pain at this time.  Patient has taken medications and patient has eaten today.

## 2015-09-16 ENCOUNTER — Ambulatory Visit: Payer: BLUE CROSS/BLUE SHIELD | Admitting: Cardiology

## 2015-10-05 ENCOUNTER — Encounter: Payer: Self-pay | Admitting: Cardiology

## 2015-10-05 ENCOUNTER — Ambulatory Visit (INDEPENDENT_AMBULATORY_CARE_PROVIDER_SITE_OTHER): Payer: BLUE CROSS/BLUE SHIELD | Admitting: Cardiology

## 2015-10-05 VITALS — BP 130/98 | HR 82 | Ht 73.0 in | Wt 180.2 lb

## 2015-10-05 DIAGNOSIS — I2510 Atherosclerotic heart disease of native coronary artery without angina pectoris: Secondary | ICD-10-CM

## 2015-10-05 DIAGNOSIS — R079 Chest pain, unspecified: Secondary | ICD-10-CM

## 2015-10-05 DIAGNOSIS — R0789 Other chest pain: Secondary | ICD-10-CM | POA: Diagnosis not present

## 2015-10-05 NOTE — Progress Notes (Signed)
PCP: Pete Glatter, MD  Clinic Note: Chief Complaint  Patient presents with  . Follow-up    False activation of STEMI. Mild CAD.    HPI: Walter Griffin is a 45 y.o. male with a PMH below who presents today for hospital f/u. 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 Developed SSCP associated with SOB, nausea and diaphoresis on the day of admission. ECG was possible STEMI so he was transported to [facility] and taken to the cath lab - non-obstructive CAD. Negative D-dimer.   Unclear etiology for CP.  Studies Reviewed:   Procedures    Left Heart Cath and Coronary Angiography - 09/02/2015    Conclusion    1. Ost LAD lesion, 35% stenosed. 2. The left ventricular systolic function is normal. 3. Normal LVEDP   Unclear etiology for the patient's chest pain and mild subtle EKG changes. But clearly no obstructive CAD noted on angiography.  Plan:  Admit overnight in telemetry unit to follow-up echocardiogram to exclude possible pericarditis as etiology.  Consider treating for GERD     Interval History: Walter Griffin returns for postcatheterization follow-up. He has noted a little bit of the discomfort that he had on admission, but notes it is much better controlled on the Protonix. He has yet to get back into doing any particular activity however.  No chest pain or shortness of breath with rest or exertion. No PND, orthopnea or edema. No palpitations, lightheadedness, dizziness, weakness or syncope/near syncope. No TIA/amaurosis fugax symptoms. No melena, hematochezia, hematuria, or epstaxis. No claudication.  ROS: A comprehensive was performed. ROS   Past Medical History  Diagnosis Date  . Sinus drainage   . H/O seasonal allergies   . GERD (gastroesophageal reflux disease)     Symptoms are concerning for possible angina. Was taken to Cath Lab. Treated with Protonix after hisCADnoted.    Past Surgical History  Procedure  Laterality Date  . Cardiac catheterization N/A 09/02/2015    Procedure: Left Heart Cath and Coronary Angiography;  Surgeon: Marykay Lex, MD;  [facility] INVASIVE CV LAB;  Service: Cardiovascular;  ostial LAD 35% stenosis at a bend point. Normal EF. Normal EDP.    Prior to Admission medications   Medication Sig Start Date End Date Taking? Authorizing Provider  ibuprofen (ADVIL,MOTRIN) 200 MG tablet Take 400 mg by mouth every 6 (six) hours as needed (pain).   Yes Historical Provider, MD  loratadine (CLARITIN) 10 MG tablet Take 10 mg by mouth daily as needed for allergies.   Yes Historical Provider, MD  pantoprazole (PROTONIX) 40 MG tablet Take 1 tablet (40 mg total) by mouth 2 (two) times daily. 09/03/15  Yes Rhonda G Barrett, PA-C   No Known Allergies  Social History   Social History  . Marital Status: Married    Spouse Name: N/A  . Number of Children: N/A  . Years of Education: N/A   Social History Main Topics  . Smoking status: Never Smoker   . Smokeless tobacco: Current User  . Alcohol Use: Yes     Comment: SOCIAL  . Drug Use: No  . Sexual Activity: Not Asked   Other Topics Concern  . None   Social History Narrative   Family History  Problem Relation Age of Onset  . Heart attack Paternal Grandfather   . Heart attack Paternal Uncle     2 uncles  . Hyperlipidemia Father   .  Hypertension Father   . Dementia Mother     died complications of DM, Dementia  . Breast cancer Mother   . Diabetes Mother   . Hyperlipidemia Mother      Wt Readings from Last 3 Encounters:  10/05/15 180 lb 3.2 oz (81.738 kg)  09/06/15 192 lb 12.8 oz (87.454 kg)  09/03/15 189 lb 2.5 oz (85.8 kg)    PHYSICAL EXAM BP 130/98 mmHg  Pulse 82  Ht 6\' 1"  (1.854 m)  Wt 180 lb 3.2 oz (81.738 kg)  BMI 23.78 kg/m2 General appearance: alert, cooperative, appears stated age, no distress and Well-nourished. Unfortunately he has a smell of significant sig cigarette smoking from his wife. He himself  does not smoke. Neck: no adenopathy, no carotid bruit and no JVD Lungs: clear to auscultation bilaterally, normal percussion bilaterally and non-labored Heart: regular rate and rhythm, S1 & S2 normal, no murmur, click, rub or gallop  Abdomen: soft, non-tender; bowel sounds normal; no masses,  no organomegaly;  Extremities: extremities normal, atraumatic, no cyanosis, or edema  Pulses: 2+ and symmetric; Neurologic: Mental status: Alert, oriented, thought content appropriate Cranial nerves: normal (II-XII grossly intact)    Adult ECG Report N/a  Other studies Reviewed: Additional studies/ records that were reviewed today include:  Recent Labs:   Lab Results  Component Value Date   CHOL 147 09/03/2015   HDL 29* 09/03/2015   LDLCALC 84 09/03/2015   TRIG 172* 09/03/2015   CHOLHDL 5.1 09/03/2015     ASSESSMENT / PLAN: Problem List Items Addressed This Visit    Coronary artery disease, non-occlusive (Chronic)    He has mild CAD noted on his catheterization, but needs to be where cardiac risk factors. Currently his blood pressures pretty well-controlled as are his lipids. He does not currently smoke. I talked about the importance of staying physically active to avoid gaining weight. He needs to monitor his glycemic control. Bleed this minor lesion will stay stable. Once she is no longer taking PPI for his GERD symptoms, I will suggest that he start a baby aspirin daily.      Chest pain of uncertain etiology - STEMI excluded - Primary    He had only mild nonocclusive CAD noted on cardiac catheterization. Symptoms improved with Protonix.  Would continue Protonix for roughly 2 months and then taper off. If symptoms recur, would recommend using H2 blockers or TUMS/Maalox.         Current medicines are reviewed at length with the patient today. (+/- concerns) n/a The following changes have been made: n/a NO CHANGES WITH CURRENT MEDICATIONS  CONTINUE with Protonix until primary  states it is okay to stop--then you can restart taking aspirin 81 mg ( baby aspirin.  Your physician wants you to follow-up in 12 months with Walter Griffin.   Studies Ordered:   No orders of the defined types were placed in this encounter.      Marykay LexHARDING, Faisal Stradling W, M.D., M.S. Interventional Cardiologist   Pager # [phone] Phone # [phone] [address] [place], [place] [id number]

## 2015-10-05 NOTE — Patient Instructions (Signed)
NO CHANGES WITH CURRENT MEDICATIONS  CONTINUE with Protonix until primary states it is okay to stop--then you can restart taking aspirin 81 mg ( baby aspirin.  Your physician wants you to follow-up in 12 months with Dr Herbie Baltimoreharding.  You will receive a reminder letter in the mail two months in advance. If you don't receive a letter, please call our office to schedule the follow-up appointment.

## 2015-10-07 ENCOUNTER — Encounter: Payer: Self-pay | Admitting: Cardiology

## 2015-10-07 DIAGNOSIS — I2510 Atherosclerotic heart disease of native coronary artery without angina pectoris: Secondary | ICD-10-CM | POA: Insufficient documentation

## 2015-10-07 NOTE — Assessment & Plan Note (Addendum)
He has mild CAD noted on his catheterization, but needs to be where cardiac risk factors. Currently his blood pressures pretty well-controlled as are his lipids. He does not currently smoke. I talked about the importance of staying physically active to avoid gaining weight. He needs to monitor his glycemic control. Bleed this minor lesion will stay stable. Once she is no longer taking PPI for his GERD symptoms, I will suggest that he start a baby aspirin daily.

## 2015-10-07 NOTE — Assessment & Plan Note (Signed)
He had only mild nonocclusive CAD noted on cardiac catheterization. Symptoms improved with Protonix.  Would continue Protonix for roughly 2 months and then taper off. If symptoms recur, would recommend using H2 blockers or TUMS/Maalox.

## 2016-06-20 ENCOUNTER — Encounter: Payer: Self-pay | Admitting: Internal Medicine

## 2016-06-20 ENCOUNTER — Ambulatory Visit: Payer: BLUE CROSS/BLUE SHIELD | Attending: Internal Medicine | Admitting: Internal Medicine

## 2016-06-20 VITALS — BP 151/99 | HR 92 | Temp 98.6°F | Resp 16 | Wt 195.4 lb

## 2016-06-20 DIAGNOSIS — Z7689 Persons encountering health services in other specified circumstances: Secondary | ICD-10-CM | POA: Diagnosis not present

## 2016-06-20 DIAGNOSIS — Z79899 Other long term (current) drug therapy: Secondary | ICD-10-CM | POA: Insufficient documentation

## 2016-06-20 DIAGNOSIS — Z114 Encounter for screening for human immunodeficiency virus [HIV]: Secondary | ICD-10-CM | POA: Insufficient documentation

## 2016-06-20 DIAGNOSIS — Z131 Encounter for screening for diabetes mellitus: Secondary | ICD-10-CM | POA: Insufficient documentation

## 2016-06-20 DIAGNOSIS — R319 Hematuria, unspecified: Secondary | ICD-10-CM | POA: Diagnosis not present

## 2016-06-20 DIAGNOSIS — Z23 Encounter for immunization: Secondary | ICD-10-CM | POA: Diagnosis not present

## 2016-06-20 DIAGNOSIS — I10 Essential (primary) hypertension: Secondary | ICD-10-CM | POA: Diagnosis not present

## 2016-06-20 DIAGNOSIS — I2510 Atherosclerotic heart disease of native coronary artery without angina pectoris: Secondary | ICD-10-CM | POA: Diagnosis not present

## 2016-06-20 DIAGNOSIS — F17220 Nicotine dependence, chewing tobacco, uncomplicated: Secondary | ICD-10-CM | POA: Diagnosis not present

## 2016-06-20 LAB — CBC WITH DIFFERENTIAL/PLATELET
BASOS ABS: 0 {cells}/uL (ref 0–200)
Basophils Relative: 0 %
Eosinophils Absolute: 91 cells/uL (ref 15–500)
Eosinophils Relative: 1 %
HEMATOCRIT: 46.2 % (ref 38.5–50.0)
HEMOGLOBIN: 15.7 g/dL (ref 13.2–17.1)
LYMPHS ABS: 2184 {cells}/uL (ref 850–3900)
Lymphocytes Relative: 24 %
MCH: 30.7 pg (ref 27.0–33.0)
MCHC: 34 g/dL (ref 32.0–36.0)
MCV: 90.4 fL (ref 80.0–100.0)
MONO ABS: 819 {cells}/uL (ref 200–950)
MPV: 10 fL (ref 7.5–12.5)
Monocytes Relative: 9 %
NEUTROS PCT: 66 %
Neutro Abs: 6006 cells/uL (ref 1500–7800)
Platelets: 382 10*3/uL (ref 140–400)
RBC: 5.11 MIL/uL (ref 4.20–5.80)
RDW: 13.5 % (ref 11.0–15.0)
WBC: 9.1 10*3/uL (ref 3.8–10.8)

## 2016-06-20 LAB — POCT URINALYSIS DIPSTICK
BILIRUBIN UA: NEGATIVE
Glucose, UA: NEGATIVE
KETONES UA: NEGATIVE
LEUKOCYTES UA: NEGATIVE
Nitrite, UA: NEGATIVE
Protein, UA: NEGATIVE
Urobilinogen, UA: 0.2
pH, UA: 7

## 2016-06-20 LAB — LIPID PANEL
Cholesterol: 201 mg/dL — ABNORMAL HIGH (ref <200)
HDL: 31 mg/dL — ABNORMAL LOW (ref >40)
LDL Cholesterol: 106 mg/dL — ABNORMAL HIGH (ref <100)
TRIGLYCERIDES: 319 mg/dL — AB (ref <150)
Total CHOL/HDL Ratio: 6.5 Ratio — ABNORMAL HIGH (ref <5.0)
VLDL: 64 mg/dL — AB (ref <30)

## 2016-06-20 LAB — BASIC METABOLIC PANEL WITH GFR
BUN: 16 mg/dL (ref 7–25)
CHLORIDE: 102 mmol/L (ref 98–110)
CO2: 23 mmol/L (ref 20–31)
CREATININE: 1.04 mg/dL (ref 0.60–1.35)
Calcium: 10 mg/dL (ref 8.6–10.3)
GFR, Est African American: >89 mL/min (ref >=60)
GFR, Est Non African American: 86 mL/min (ref >=60)
Glucose, Bld: 93 mg/dL (ref 65–99)
POTASSIUM: 4 mmol/L (ref 3.5–5.3)
Sodium: 136 mmol/L (ref 135–146)

## 2016-06-20 LAB — HIV ANTIBODY (ROUTINE TESTING W REFLEX): HIV 1&2 Ab, 4th Generation: NONREACTIVE

## 2016-06-20 LAB — PSA: PSA: 0.6 ng/mL (ref <=4.0)

## 2016-06-20 LAB — POCT GLYCOSYLATED HEMOGLOBIN (HGB A1C): HEMOGLOBIN A1C: 5.6

## 2016-06-20 MED ORDER — HYDROCHLOROTHIAZIDE 25 MG PO TABS: 25.0000 mg | ORAL_TABLET | Freq: Every day | ORAL | 3 refills | Status: DC

## 2016-06-20 NOTE — Progress Notes (Signed)
Walter Griffin, is a 46 y.o. male  NFA:[id number]  ION:[id number]  DOB - 1971/01/16  CC:  Chief Complaint  Patient presents with  . Establish Care  . Hematuria  . Hypertension       HPI: Emran Molzahn is a 46 y.o. male here today to establish medical care, last seen in clinic 4/17 by PA. Hx of diet controlled htn.  Pt is EMT, and had DOT physical few weeks ago. Noted htn and hematuria.  Pt denies urinary frequency, dysuria or urinary hesitancy.  He does eat/cook w/ salt. Chews tob. Rare etoh.    Patient has No headache, No chest pain, No abdominal pain - No Nausea, No new weakness tingling or numbness, No Cough - SOB.    Review of Systems: Per hpi, o/w all systems reviewed and negative.    No Known Allergies Past Medical History:  Diagnosis Date  . GERD (gastroesophageal reflux disease)    Symptoms are concerning for possible angina. Was taken to Cath Lab. Treated with Protonix after hisCADnoted.  . H/O seasonal allergies   . Sinus drainage    Current Outpatient Prescriptions on File Prior to Visit  Medication Sig Dispense Refill  . ibuprofen (ADVIL,MOTRIN) 200 MG tablet Take 400 mg by mouth every 6 (six) hours as needed (pain).    Marland Kitchen loratadine (CLARITIN) 10 MG tablet Take 10 mg by mouth daily as needed for allergies.     No current facility-administered medications on file prior to visit.    Family History  Problem Relation Age of Onset  . Heart attack Paternal Grandfather   . Heart attack Paternal Uncle     2 uncles  . Hyperlipidemia Father   . Hypertension Father   . Dementia Mother     died complications of DM, Dementia  . Breast cancer Mother   . Diabetes Mother   . Hyperlipidemia Mother    Social History   Social History  . Marital status: Married    Spouse name: N/A  . Number of children: N/A  . Years of education: N/A   Occupational History  . Not on file.   Social History Main Topics  . Smoking status: Never Smoker  . Smokeless tobacco:  Current User  . Alcohol use Yes     Comment: SOCIAL  . Drug use: No  . Sexual activity: Not on file   Other Topics Concern  . Not on file   Social History Narrative  . No narrative on file    Objective:   Vitals:   06/20/16 1511  BP: (!) 151/99  Pulse: 92  Resp: 16  Temp: 98.6 F (37 C)    Filed Weights   06/20/16 1511  Weight: 195 lb 6.4 oz (88.6 kg)    BP Readings from Last 3 Encounters:  06/20/16 (!) 151/99  10/05/15 (!) 130/98  09/06/15 129/80    Physical Exam: Constitutional: Patient appears well-developed and well-nourished. No distress. AAOx3, pleasant HENT: Normocephalic, atraumatic, External right and left ear normal. Oropharynx is clear and moist.  bilat TMs clear. Eyes: Conjunctivae and EOM are normal. PERRL, no scleral icterus. Neck: Normal ROM. Neck supple. No JVD.  CVS: RRR, S1/S2 +, no murmurs, no gallops, no carotid bruit.  Pulmonary: Effort and breath sounds normal, no stridor, rhonchi, wheezes, rales.  Abdominal: Soft. BS +, no distension, tenderness, rebound or guarding.  Musculoskeletal: Normal range of motion. No edema and no tenderness.  LE: bilat/ no c/c/e, pulses 2+ bilateral. Neuro: Alert. muscle tone coordination  wnl. No cranial nerve deficit grossly. Skin: Skin is warm and dry. No rash noted. Not diaphoretic. No erythema. No pallor. Psychiatric: Normal mood and affect. Behavior, judgment, thought content normal.  Lab Results  Component Value Date   WBC 11.1 (H) 09/03/2015   HGB 13.4 09/03/2015   HCT 41.9 09/03/2015   MCV 90.7 09/03/2015   PLT 310 09/03/2015   Lab Results  Component Value Date   CREATININE 0.95 09/03/2015   BUN 10 09/03/2015   NA 138 09/03/2015   K 3.8 09/03/2015   CL 106 09/03/2015   CO2 23 09/03/2015    Lab Results  Component Value Date   HGBA1C 5.6 06/20/2016   Lipid Panel     Component Value Date/Time   CHOL 147 09/03/2015 0400   TRIG 172 (H) 09/03/2015 0400   HDL 29 (L) 09/03/2015 0400    CHOLHDL 5.1 09/03/2015 0400   VLDL 34 09/03/2015 0400   LDLCALC 84 09/03/2015 0400        Depression screen PHQ 2/9 06/20/2016 09/06/2015  Decreased Interest 2 0  Down, Depressed, Hopeless 2 0  PHQ - 2 Score 4 0  Altered sleeping 3 -  Tired, decreased energy 1 -  Change in appetite 1 -  Feeling bad or failure about yourself  1 -  Trouble concentrating 1 -  Moving slowly or fidgety/restless 0 -  Suicidal thoughts 0 -  PHQ-9 Score 11 -    Left Heart Cath and Coronary Angiography - 09/02/2015    Conclusion    1. Ost LAD lesion, 35% stenosed. 2. The left ventricular systolic function is normal. 3. Normal LVEDP    Assessment and plan:   1. Essential hypertension Low salt diet discussed, recd increase exercise, walk at least 30mins /day, work up to that goal slowly if not use to exercise. - added hctz 25 qd today. - BASIC METABOLIC PANEL WITH GFR - CBC with Differential - rn bp check 2 wks, w/ Eustace Penravia.  If sbp 130, than start prinzide 10-12.5 qd and dc hctz 25qd.    2. Coronary artery disease, non-occlusive - hx of lhc w/ nonocclusive dz 09/02/15 - Lipid Panel - total tob cessation recd  3. Hematuria, unspecified type, painless. Etiology unclear, may need uro cs, still waiting for urine, pt has been drinking water since this pm. - PSA - Urinalysis Dipstick, hematuria again noted.  No uti - amb ref uro placed.  4. Diabetes mellitus screening - HgB A1c  5.6  5. Encounter for screening for HIV - HIV antibody (with reflex)  6. Chewing tobacco nicotine dependence without complication Total cessation recd  7. Encounter for immunization - pneumococcal 23v today. - Flu Vaccine QUAD 36+ mos IM - pt states he had his tdap about 7 years ago.   Return in about 3 months (around 09/18/2016).  The patient was given clear instructions to go to ER or return to medical center if symptoms don't improve, worsen or new problems develop. The patient verbalized understanding. The patient  was told to call to get lab results if they haven't heard anything in the next week.    This note has been created with Education officer, environmentalDragon speech recognition software and smart phrase technology. Any transcriptional errors are unintentional.   Pete Glatterawn T Altus Zaino, MD, MBA/MHA [facility] [place], [place] [phone]   06/20/2016, 4:42 PM

## 2016-06-20 NOTE — Patient Instructions (Addendum)
- bp check w/ [facility] 2 wks  Low-Sodium Eating Plan Sodium raises blood pressure and causes water to be held in the body. Getting less sodium from food will help lower your blood pressure, reduce any swelling, and protect your heart, liver, and kidneys. We get sodium by adding salt (sodium chloride) to food. Most of our sodium comes from canned, boxed, and frozen foods. Restaurant foods, fast foods, and pizza are also very high in sodium. Even if you take medicine to lower your blood pressure or to reduce fluid in your body, getting less sodium from your food is important. What is my plan? Most people should limit their sodium intake to 2,300 mg a day. Your health care provider recommends that you limit your sodium intake to 000mg  a day. What do I need to know about this eating plan? For the low-sodium eating plan, you will follow these general guidelines:  Choose foods with a % Daily Value for sodium of less than 5% (as listed on the food label).  Use salt-free seasonings or herbs instead of table salt or sea salt.  Check with your health care provider or pharmacist before using salt substitutes.  Eat fresh foods.  Eat more vegetables and fruits.  Limit canned vegetables. If you do use them, rinse them well to decrease the sodium.  Limit cheese to 1 oz (28 g) per day.  Eat lower-sodium products, often labeled as "lower sodium" or "no salt added."  Avoid foods that contain monosodium glutamate (MSG). MSG is sometimes added to [place] food and some canned foods.  Check food labels (Nutrition Facts labels) on foods to learn how much sodium is in one serving.  Eat more home-cooked food and less restaurant, buffet, and fast food.  When eating at a restaurant, ask that your food be prepared with less salt, or no salt if possible. How do I read food labels for sodium information? The Nutrition Facts label lists the amount of sodium in one serving of the food. If you eat more than one  serving, you must multiply the listed amount of sodium by the number of servings. Food labels may also identify foods as:  Sodium free-Less than 5 mg in a serving.  Very low sodium-35 mg or less in a serving.  Low sodium-140 mg or less in a serving.  Light in sodium-50% less sodium in a serving. For example, if a food that usually has 300 mg of sodium is changed to become light in sodium, it will have 150 mg of sodium.  Reduced sodium-25% less sodium in a serving. For example, if a food that usually has 400 mg of sodium is changed to reduced sodium, it will have 300 mg of sodium. What foods can I eat? Grains  Low-sodium cereals, including oats, puffed wheat and rice, and shredded wheat cereals. Low-sodium crackers. Unsalted rice and pasta. Lower-sodium bread. Vegetables  Frozen or fresh vegetables. Low-sodium or reduced-sodium canned vegetables. Low-sodium or reduced-sodium tomato sauce and paste. Low-sodium or reduced-sodium tomato and vegetable juices. Fruits  Fresh, frozen, and canned fruit. Fruit juice. Meat and Other Protein Products  Low-sodium canned tuna and salmon. Fresh or frozen meat, poultry, seafood, and fish. Lamb. Unsalted nuts. Dried beans, peas, and lentils without added salt. Unsalted canned beans. Homemade soups without salt. Eggs. Dairy  Milk. Soy milk. Ricotta cheese. Low-sodium or reduced-sodium cheeses. Yogurt. Condiments  Fresh and dried herbs and spices. Salt-free seasonings. Onion and garlic powders. Low-sodium varieties of mustard and ketchup. Fresh or refrigerated  horseradish. Lemon juice. Fats and Oils  Reduced-sodium salad dressings. Unsalted butter. Other  Unsalted popcorn and pretzels. The items listed above may not be a complete list of recommended foods or beverages. Contact your dietitian for more options.  What foods are not recommended? Grains  Instant hot cereals. Bread stuffing, pancake, and biscuit mixes. Croutons. Seasoned rice or pasta mixes.  Noodle soup cups. Boxed or frozen macaroni and cheese. Self-rising flour. Regular salted crackers. Vegetables  Regular canned vegetables. Regular canned tomato sauce and paste. Regular tomato and vegetable juices. Frozen vegetables in sauces. Salted JamaicaFrench fries. Olives. Rosita FirePickles. Relishes. Sauerkraut. Salsa. Meat and Other Protein Products  Salted, canned, smoked, spiced, or pickled meats, seafood, or fish. Bacon, ham, sausage, hot dogs, corned beef, chipped beef, and packaged luncheon meats. Salt pork. Jerky. Pickled herring. Anchovies, regular canned tuna, and sardines. Salted nuts. Dairy  Processed cheese and cheese spreads. Cheese curds. Blue cheese and cottage cheese. Buttermilk. Condiments  Onion and garlic salt, seasoned salt, table salt, and sea salt. Canned and packaged gravies. Worcestershire sauce. Tartar sauce. Barbecue sauce. Teriyaki sauce. Soy sauce, including reduced sodium. Steak sauce. Fish sauce. Oyster sauce. Cocktail sauce. Horseradish that you find on the shelf. Regular ketchup and mustard. Meat flavorings and tenderizers. Bouillon cubes. Hot sauce. Tabasco sauce. Marinades. Taco seasonings. Relishes. Fats and Oils  Regular salad dressings. Salted butter. Margarine. Ghee. Bacon fat. Other  Potato and tortilla chips. Corn chips and puffs. Salted popcorn and pretzels. Canned or dried soups. Pizza. Frozen entrees and pot pies. The items listed above may not be a complete list of foods and beverages to avoid. Contact your dietitian for more information.  This information is not intended to replace advice given to you by your health care provider. Make sure you discuss any questions you have with your health care provider. Document Released: 11/04/2001 Document Revised: 10/21/2015 Document Reviewed: 03/19/2013 Elsevier Interactive Patient Education  2017 [employer]  -  Steps to Quit Smoking Smoking tobacco can be bad for your health. It can also affect almost every organ in  your body. Smoking puts you and people around you at risk for many serious long-lasting (chronic) diseases. Quitting smoking is hard, but it is one of the best things that you can do for your health. It is never too late to quit. What are the benefits of quitting smoking? When you quit smoking, you lower your risk for getting serious diseases and conditions. They can include:  Lung cancer or lung disease.  Heart disease.  Stroke.  Heart attack.  Not being able to have children (infertility).  Weak bones (osteoporosis) and broken bones (fractures). If you have coughing, wheezing, and shortness of breath, those symptoms may get better when you quit. You may also get sick less often. If you are pregnant, quitting smoking can help to lower your chances of having a baby of low birth weight. What can I do to help me quit smoking? Talk with your doctor about what can help you quit smoking. Some things you can do (strategies) include:  Quitting smoking totally, instead of slowly cutting back how much you smoke over a period of time.  Going to in-person counseling. You are more likely to quit if you go to many counseling sessions.  Using resources and support systems, such as:  Online chats with a Veterinary surgeoncounselor.  Phone quitlines.  Printed Materials engineerself-help materials.  Support groups or group counseling.  Text messaging programs.  Mobile phone apps or applications.  Taking medicines. Some  of these medicines may have nicotine in them. If you are pregnant or breastfeeding, do not take any medicines to quit smoking unless your doctor says it is okay. Talk with your doctor about counseling or other things that can help you. Talk with your doctor about using more than one strategy at the same time, such as taking medicines while you are also going to in-person counseling. This can help make quitting easier. What things can I do to make it easier to quit? Quitting smoking might feel very hard at first,  but there is a lot that you can do to make it easier. Take these steps:  Talk to your family and friends. Ask them to support and encourage you.  Call phone quitlines, reach out to support groups, or work with a Veterinary surgeon.  Ask people who smoke to not smoke around you.  Avoid places that make you want (trigger) to smoke, such as:  Bars.  Parties.  Smoke-break areas at work.  Spend time with people who do not smoke.  Lower the stress in your life. Stress can make you want to smoke. Try these things to help your stress:  Getting regular exercise.  Deep-breathing exercises.  Yoga.  Meditating.  Doing a body scan. To do this, close your eyes, focus on one area of your body at a time from head to toe, and notice which parts of your body are tense. Try to relax the muscles in those areas.  Download or buy apps on your mobile phone or tablet that can help you stick to your quit plan. There are many free apps, such as QuitGuide from the [employer] developer for Disease Control and Prevention). You can find more support from smokefree.gov and other websites. This information is not intended to replace advice given to you by your health care provider. Make sure you discuss any questions you have with your health care provider. Document Released: 03/11/2009 Document Revised: 01/11/2016 Document Reviewed: 09/29/2014 Elsevier Interactive Patient Education  2017 Elsevier Inc.   - Health Maintenance, Male A healthy lifestyle and preventative care can promote health and wellness.  Maintain regular health, dental, and eye exams.  Eat a healthy diet. Foods like vegetables, fruits, whole grains, low-fat dairy products, and lean protein foods contain the nutrients you need and are low in calories. Decrease your intake of foods high in solid fats, added sugars, and salt. Get information about a proper diet from your health care provider, if necessary.  Regular physical exercise is one of the most  important things you can do for your health. Most adults should get at least 150 minutes of moderate-intensity exercise (any activity that increases your heart rate and causes you to sweat) each week. In addition, most adults need muscle-strengthening exercises on 2 or more days a week.   Maintain a healthy weight. The body mass index (BMI) is a screening tool to identify possible weight problems. It provides an estimate of body fat based on height and weight. Your health care provider can find your BMI and can help you achieve or maintain a healthy weight. For males 20 years and older:  A BMI below 18.5 is considered underweight.  A BMI of 18.5 to 24.9 is normal.  A BMI of 25 to 29.9 is considered overweight.  A BMI of 30 and above is considered obese.  Maintain normal blood lipids and cholesterol by exercising and minimizing your intake of saturated fat. Eat a balanced diet with plenty of fruits and vegetables.  Blood tests for lipids and cholesterol should begin at age 41 and be repeated every 5 years. If your lipid or cholesterol levels are high, you are over age 44, or you are at high risk for heart disease, you may need your cholesterol levels checked more frequently.Ongoing high lipid and cholesterol levels should be treated with medicines if diet and exercise are not working.  If you smoke, find out from your health care provider how to quit. If you do not use tobacco, do not start.  Lung cancer screening is recommended for adults aged 55-80 years who are at high risk for developing lung cancer because of a history of smoking. A yearly low-dose CT scan of the lungs is recommended for people who have at least a 30-pack-year history of smoking and are current smokers or have quit within the past 15 years. A pack year of smoking is smoking an average of 1 pack of cigarettes a day for 1 year (for example, a 30-pack-year history of smoking could mean smoking 1 pack a day for 30 years or 2 packs a  day for 15 years). Yearly screening should continue until the smoker has stopped smoking for at least 15 years. Yearly screening should be stopped for people who develop a health problem that would prevent them from having lung cancer treatment.  If you choose to drink alcohol, do not have more than 2 drinks per day. One drink is considered to be 12 oz (360 mL) of beer, 5 oz (150 mL) of wine, or 1.5 oz (45 mL) of liquor.  Avoid the use of street drugs. Do not share needles with anyone. Ask for help if you need support or instructions about stopping the use of drugs.  High blood pressure causes heart disease and increases the risk of stroke. High blood pressure is more likely to develop in:  People who have blood pressure in the end of the normal range (100-139/85-89 mm Hg).  People who are overweight or obese.  People who are African American.  If you are 22-17 years of age, have your blood pressure checked every 3-5 years. If you are 63 years of age or older, have your blood pressure checked every year. You should have your blood pressure measured twice-once when you are at a hospital or clinic, and once when you are not at a hospital or clinic. Record the average of the two measurements. To check your blood pressure when you are not at a hospital or clinic, you can use:  An automated blood pressure machine at a pharmacy.  A home blood pressure monitor.  If you are 39-65 years old, ask your health care provider if you should take aspirin to prevent heart disease.  Diabetes screening involves taking a blood sample to check your fasting blood sugar level. This should be done once every 3 years after age 47 if you are at a normal weight and without risk factors for diabetes. Testing should be considered at a younger age or be carried out more frequently if you are overweight and have at least 1 risk factor for diabetes.  Colorectal cancer can be detected and often prevented. Most routine  colorectal cancer screening begins at the age of 71 and continues through age 55. However, your health care provider may recommend screening at an earlier age if you have risk factors for colon cancer. On a yearly basis, your health care provider may provide home test kits to check for hidden blood in the stool.  A small camera at the end of a tube may be used to directly examine the colon (sigmoidoscopy or colonoscopy) to detect the earliest forms of colorectal cancer. Talk to your health care provider about this at age 83 when routine screening begins. A direct exam of the colon should be repeated every 5-10 years through age 18, unless early forms of precancerous polyps or small growths are found.  People who are at an increased risk for hepatitis B should be screened for this virus. You are considered at high risk for hepatitis B if:  You were born in a country where hepatitis B occurs often. Talk with your health care provider about which countries are considered high risk.  Your parents were born in a high-risk country and you have not received a shot to protect against hepatitis B (hepatitis B vaccine).  You have HIV or AIDS.  You use needles to inject street drugs.  You live with, or have sex with, someone who has hepatitis B.  You are a man who has sex with other men (MSM).  You get hemodialysis treatment.  You take certain medicines for conditions like cancer, organ transplantation, and autoimmune conditions.  Hepatitis C blood testing is recommended for all people born from [birth year] through 1966 and any individual with known risk factors for hepatitis C.  Healthy men should no longer receive prostate-specific antigen (PSA) blood tests as part of routine cancer screening. Talk to your health care provider about prostate cancer screening.  Testicular cancer screening is not recommended for adolescents or adult males who have no symptoms. Screening includes self-exam, a health care  provider exam, and other screening tests. Consult with your health care provider about any symptoms you have or any concerns you have about testicular cancer.  Practice safe sex. Use condoms and avoid high-risk sexual practices to reduce the spread of sexually transmitted infections (STIs).  You should be screened for STIs, including gonorrhea and chlamydia if:  You are sexually active and are younger than 24 years.  You are older than 24 years, and your health care provider tells you that you are at risk for this type of infection.  Your sexual activity has changed since you were last screened, and you are at an increased risk for chlamydia or gonorrhea. Ask your health care provider if you are at risk.  If you are at risk of being infected with HIV, it is recommended that you take a prescription medicine daily to prevent HIV infection. This is called pre-exposure prophylaxis (PrEP). You are considered at risk if:  You are a man who has sex with other men (MSM).  You are a heterosexual man who is sexually active with multiple partners.  You take drugs by injection.  You are sexually active with a partner who has HIV.  Talk with your health care provider about whether you are at high risk of being infected with HIV. If you choose to begin PrEP, you should first be tested for HIV. You should then be tested every 3 months for as long as you are taking PrEP.  Use sunscreen. Apply sunscreen liberally and repeatedly throughout the day. You should seek shade when your shadow is shorter than you. Protect yourself by wearing long sleeves, pants, a wide-brimmed hat, and sunglasses year round whenever you are outdoors.  Tell your health care provider of new moles or changes in moles, especially if there is a change in shape or color. Also, tell your health care provider if  a mole is larger than the size of a pencil eraser.  A one-time screening for abdominal aortic aneurysm (AAA) and surgical  repair of large AAAs by ultrasound is recommended for men aged 65-75 years who are current or former smokers.  Stay current with your vaccines (immunizations). This information is not intended to replace advice given to you by your health care provider. Make sure you discuss any questions you have with your health care provider. Document Released: 11/11/2007 Document Revised: 06/05/2014 Document Reviewed: 02/16/2015 Elsevier Interactive Patient Education  2017 Elsevier Inc.  Influenza Virus Vaccine injection (Fluarix) What is this medicine? INFLUENZA VIRUS VACCINE (in floo EN zuh VAHY ruhs vak SEEN) helps to reduce the risk of getting influenza also known as the flu. This medicine may be used for other purposes; ask your health care provider or pharmacist if you have questions. COMMON BRAND NAME(S): Fluarix, Fluzone What should I tell my health care provider before I take this medicine? They need to know if you have any of these conditions: -bleeding disorder like hemophilia -fever or infection -Guillain-Barre syndrome or other neurological problems -immune system problems -infection with the human immunodeficiency virus (HIV) or AIDS -low blood platelet counts -multiple sclerosis -an unusual or allergic reaction to influenza virus vaccine, eggs, chicken proteins, latex, gentamicin, other medicines, foods, dyes or preservatives -pregnant or trying to get pregnant -breast-feeding How should I use this medicine? This vaccine is for injection into a muscle. It is given by a health care professional. A copy of Vaccine Information Statements will be given before each vaccination. Read this sheet carefully each time. The sheet may change frequently. Talk to your pediatrician regarding the use of this medicine in children. Special care may be needed. Overdosage: If you think you have taken too much of this medicine contact a poison control center or emergency room at once. NOTE: This medicine  is only for you. Do not share this medicine with others. What if I miss a dose? This does not apply. What may interact with this medicine? -chemotherapy or radiation therapy -medicines that lower your immune system like etanercept, anakinra, infliximab, and adalimumab -medicines that treat or prevent blood clots like warfarin -phenytoin -steroid medicines like prednisone or cortisone -theophylline -vaccines This list may not describe all possible interactions. Give your health care provider a list of all the medicines, herbs, non-prescription drugs, or dietary supplements you use. Also tell them if you smoke, drink alcohol, or use illegal drugs. Some items may interact with your medicine. What should I watch for while using this medicine? Report any side effects that do not go away within 3 days to your doctor or health care professional. Call your health care provider if any unusual symptoms occur within 6 weeks of receiving this vaccine. You may still catch the flu, but the illness is not usually as bad. You cannot get the flu from the vaccine. The vaccine will not protect against colds or other illnesses that may cause fever. The vaccine is needed every year. What side effects may I notice from receiving this medicine? Side effects that you should report to your doctor or health care professional as soon as possible: -allergic reactions like skin rash, itching or hives, swelling of the face, lips, or tongue Side effects that usually do not require medical attention (report to your doctor or health care professional if they continue or are bothersome): -fever -headache -muscle aches and pains -pain, tenderness, redness, or swelling at site where injected -weak or tired  This list may not describe all possible side effects. Call your doctor for medical advice about side effects. You may report side effects to FDA at 1-800-FDA-1088. Where should I keep my medicine? This vaccine is only given  in a clinic, pharmacy, doctor's office, or other health care setting and will not be stored at home. NOTE: This sheet is a summary. It may not cover all possible information. If you have questions about this medicine, talk to your doctor, pharmacist, or health care provider.  2017 Elsevier/Gold Standard (2007-12-11 09:30:40) Pneumococcal Polysaccharide Vaccine: What You Need to Know 1. Why get vaccinated? Vaccination can protect older adults (and some children and younger adults) from pneumococcal disease. Pneumococcal disease is caused by bacteria that can spread from person to person through close contact. It can cause ear infections, and it can also lead to more serious infections of the:  Lungs (pneumonia),  Blood (bacteremia), and  Covering of the brain and spinal cord (meningitis). Meningitis can cause deafness and brain damage, and it can be fatal. Anyone can get pneumococcal disease, but children under 43 years of age, people with certain medical conditions, adults over 59 years of age, and cigarette smokers are at the highest risk. About 18,000 older adults die each year from pneumococcal disease in the [place]. Treatment of pneumococcal infections with penicillin and other drugs used to be more effective. But some strains of the disease have become resistant to these drugs. This makes prevention of the disease, through vaccination, even more important. 2. Pneumococcal polysaccharide vaccine (PPSV23) Pneumococcal polysaccharide vaccine (PPSV23) protects against 23 types of pneumococcal bacteria. It will not prevent all pneumococcal disease. PPSV23 is recommended for:  All adults 57 years of age and older,  Anyone 2 through 47 years of age with certain long-term health problems,  Anyone 2 through 47 years of age with a weakened immune system,  Adults 3 through 47 years of age who smoke cigarettes or have asthma. Most people need only one dose of PPSV. A second dose is  recommended for certain high-risk groups. People 27 and older should get a dose even if they have gotten one or more doses of the vaccine before they turned 65. Your healthcare provider can give you more information about these recommendations. Most healthy adults develop protection within 2 to 3 weeks of getting the shot. 3. Some people should not get this vaccine  Anyone who has had a life-threatening allergic reaction to PPSV should not get another dose.  Anyone who has a severe allergy to any component of PPSV should not receive it. Tell your provider if you have any severe allergies.  Anyone who is moderately or severely ill when the shot is scheduled may be asked to wait until they recover before getting the vaccine. Someone with a mild illness can usually be vaccinated.  Children less than 72 years of age should not receive this vaccine.  There is no evidence that PPSV is harmful to either a pregnant woman or to her fetus. However, as a precaution, women who need the vaccine should be vaccinated before becoming pregnant, if possible. 4. Risks of a vaccine reaction With any medicine, including vaccines, there is a chance of side effects. These are usually mild and go away on their own, but serious reactions are also possible. About half of people who get PPSV have mild side effects, such as redness or pain where the shot is given, which go away within about two days. Less than 1 out  of 100 people develop a fever, muscle aches, or more severe local reactions. Problems that could happen after any vaccine:  People sometimes faint after a medical procedure, including vaccination. Sitting or lying down for about 15 minutes can help prevent fainting, and injuries caused by a fall. Tell your doctor if you feel dizzy, or have vision changes or ringing in the ears.  Some people get severe pain in the shoulder and have difficulty moving the arm where a shot was given. This happens very  rarely.  Any medication can cause a severe allergic reaction. Such reactions from a vaccine are very rare, estimated at about 1 in a million doses, and would happen within a few minutes to a few hours after the vaccination. As with any medicine, there is a very remote chance of a vaccine causing a serious injury or death. The safety of vaccines is always being monitored. For more information, visit: http://floyd.org/ 5. What if there is a serious reaction? What should I look for? Look for anything that concerns you, such as signs of a severe allergic reaction, very high fever, or unusual behavior. Signs of a severe allergic reaction can include hives, swelling of the face and throat, difficulty breathing, a fast heartbeat, dizziness, and weakness. These would usually start a few minutes to a few hours after the vaccination. What should I do? If you think it is a severe allergic reaction or other emergency that can't wait, call 9-1-1 or get to the nearest hospital. Otherwise, call your doctor. Afterward, the reaction should be reported to the Vaccine Adverse Event Reporting System (VAERS). Your doctor might file this report, or you can do it yourself through the VAERS web site at www.vaers.LAgents.no, or by calling 1-(734)033-6718. VAERS does not give medical advice. 6. How can I learn more?  Ask your doctor. He or she can give you the vaccine package insert or suggest other sources of information.  Call your local or state health department.  Contact the Centers for Disease Control and Prevention (CDC):  Call 760-804-9712 (1-800-CDC-INFO) or  Visit CDC's website at PicCapture.uy CDC Pneumococcal Polysaccharide Vaccine VIS (09/19/13) This information is not intended to replace advice given to you by your health care provider. Make sure you discuss any questions you have with your health care provider. Document Released: 03/12/2006 Document Revised: 02/03/2016 Document Reviewed:  02/03/2016 Elsevier Interactive Patient Education  2017 [employer].

## 2016-06-21 ENCOUNTER — Other Ambulatory Visit: Payer: Self-pay | Admitting: Internal Medicine

## 2016-06-21 ENCOUNTER — Telehealth: Payer: Self-pay

## 2016-06-21 MED ORDER — PRAVASTATIN SODIUM 20 MG PO TABS: 20.0000 mg | ORAL_TABLET | Freq: Every day | ORAL | 3 refills | Status: DC

## 2016-06-21 NOTE — Telephone Encounter (Signed)
Contacted pt to go over lab results lvm asking pt to give me a call back at his earliest convenience

## 2016-06-23 ENCOUNTER — Telehealth: Payer: Self-pay

## 2016-06-23 NOTE — Telephone Encounter (Signed)
Contacted pt to go over lab results pt didn't answer lvm asking pt to give me a call back at his earliest convenience  

## 2016-07-03 ENCOUNTER — Ambulatory Visit: Payer: BLUE CROSS/BLUE SHIELD | Attending: Internal Medicine | Admitting: *Deleted

## 2016-07-03 VITALS — BP 116/80

## 2016-07-03 DIAGNOSIS — I10 Essential (primary) hypertension: Secondary | ICD-10-CM | POA: Diagnosis not present

## 2016-07-03 NOTE — Progress Notes (Signed)
Pt here for f/u BP check after office visit on 06/20/2016 with Dr. Julien NordmannLangeland. Pt denies chest pain, SOB, HA, new vison concerns, or generalized swelling and he has been taking medications as prescribed. Pt states he is  taking  blood pressure at home. Blood pressure taken manually while patient is sitting. No medication adjustments needed. Systolic BP is less that 130.  Pt encouraged to continue to take blood pressure and keep a log of them at home. Instructed to call or email  Readings using myChart app to Dr. Julien NordmannLangeland. Pt verbalized understanding.  Nurse visit will be routed to provider.Guy Francoravia Naviah Belfield, RN, BSN

## 2016-08-17 DIAGNOSIS — R3129 Other microscopic hematuria: Secondary | ICD-10-CM | POA: Diagnosis not present

## 2016-08-17 DIAGNOSIS — Z024 Encounter for examination for driving license: Secondary | ICD-10-CM | POA: Diagnosis not present

## 2016-08-17 DIAGNOSIS — Z0000 Encounter for general adult medical examination without abnormal findings: Secondary | ICD-10-CM | POA: Diagnosis not present

## 2016-09-14 DIAGNOSIS — R3129 Other microscopic hematuria: Secondary | ICD-10-CM | POA: Diagnosis not present

## 2016-09-20 ENCOUNTER — Encounter: Payer: Self-pay | Admitting: Internal Medicine

## 2016-09-20 NOTE — Progress Notes (Signed)
Pt seen by [facility], 09/14/16 for hematuria, since < 3 or greaterd rbc/hpf, will not proceed w/ further wkup for now. f/u 6months. Saw Dr Cleda Mccreedy.  Will scan notes.

## 2016-10-12 ENCOUNTER — Encounter: Payer: Self-pay | Admitting: Internal Medicine

## 2016-10-20 ENCOUNTER — Encounter: Payer: Self-pay | Admitting: Cardiology

## 2016-10-20 ENCOUNTER — Ambulatory Visit (INDEPENDENT_AMBULATORY_CARE_PROVIDER_SITE_OTHER): Payer: BLUE CROSS/BLUE SHIELD | Admitting: Cardiology

## 2016-10-20 VITALS — BP 122/88 | HR 73 | Ht 73.0 in | Wt 197.2 lb

## 2016-10-20 DIAGNOSIS — E785 Hyperlipidemia, unspecified: Secondary | ICD-10-CM

## 2016-10-20 DIAGNOSIS — I2510 Atherosclerotic heart disease of native coronary artery without angina pectoris: Secondary | ICD-10-CM

## 2016-10-20 DIAGNOSIS — I10 Essential (primary) hypertension: Secondary | ICD-10-CM

## 2016-10-20 NOTE — Patient Instructions (Signed)
NO CHANGES WITH CURRENT MEDICATIONS      Your physician wants you to follow-up in 12 MONTHS WITH DR HARDING. IF YOU FEEL OKAY CAN RETURN IN 2 YEARS INSTEAD.You will receive a reminder letter in the mail two months in advance. If you don't receive a letter, please call our office to schedule the follow-up appointment.    If you need a refill on your cardiac medications before your next appointment, please call your pharmacy.

## 2016-10-20 NOTE — Progress Notes (Signed)
PCP: Pete Glatter, MD  Clinic Note: Chief Complaint  Patient presents with  . Follow-up     likely noncardiac chest pain by catheterization. He has hypertension and hyperlipidemia    HPI: Walter Griffin is a 46 y.o. male with a PMH below who presents today for annual f/u from CP evaluation on 10/05/2015 at the request of Dierdre Searles T, MD. He has mild CAD by cath  Berkley Harvey was last seen on 10/05/2015  Recent Hospitalizations: no recent hospitalizations  Studies Personally Reviewed - (if available, images/films reviewed: From Epic Chart or Care Everywhere)  Cardiac Cath 08/2015: ostial LAD ~35% otherwise normal. Normal EF & LVEDP. --> Non-anginal CP  Interval History:  Laural Benes presents today for annual follow-up is doing quite well from a cardiac standpoint. He only notes the tightness in his chest when he has wheezing spells from his allergies/asthma.  The symptom seemed to resolve when he takes his antihistamine or use his inhaler. He otherwise is doing quite well with only some exertional dyspnea if he overexerts.  No exertional chest pain. No PND, orthopnea or edema. No palpitations, lightheadedness, dizziness, weakness or syncope/near syncope. No TIA/amaurosis fugax symptoms.  No claudication.   He was recently started on HCTZ by his PCP for hypertension, along with Pravachol for dyslipidemia  ROS: A comprehensive was performed. Review of Systems  Constitutional: Negative for malaise/fatigue.  HENT: Positive for congestion.   Respiratory: Positive for cough, shortness of breath (Some DOE with vigourous activity or with allergies) and wheezing.        Associated with allergies -- with wheezing, he notes chest tightness that resolves with inhalers or taking his antihistamine.  Gastrointestinal: Negative for blood in stool and melena.  Genitourinary: Negative for hematuria.  Musculoskeletal: Positive for back pain (with sciatic radiculopathy).    Endo/Heme/Allergies: Positive for environmental allergies.  Psychiatric/Behavioral: Negative for depression and memory loss. The patient is not nervous/anxious and does not have insomnia.   All other systems reviewed and are negative.  I have reviewed and (if needed) personally updated the patient's problem list, medications, allergies, past medical and surgical history, social and family history.   Past Medical History:  Diagnosis Date  . GERD (gastroesophageal reflux disease)    Symptoms are concerning for possible angina. Was taken to Cath Lab. Treated with Protonix after hisCADnoted.  . H/O seasonal allergies   . Sinus drainage     Past Surgical History:  Procedure Laterality Date  . CARDIAC CATHETERIZATION N/A 09/02/2015   Procedure: Left Heart Cath and Coronary Angiography;  Surgeon: Marykay Lex, MD;  Location: [facility] INVASIVE CV LAB;  Service: Cardiovascular;  ostial LAD 35% stenosis at a bend point. Normal EF. Normal EDP.    Current Meds  Medication Sig  . hydrochlorothiazide (HYDRODIURIL) 25 MG tablet Take 1 tablet (25 mg total) by mouth daily.  Marland Kitchen ibuprofen (ADVIL,MOTRIN) 200 MG tablet Take 400 mg by mouth every 6 (six) hours as needed (pain).  Marland Kitchen loratadine (CLARITIN) 10 MG tablet Take 10 mg by mouth daily as needed for allergies.  . pravastatin (PRAVACHOL) 20 MG tablet Take 1 tablet (20 mg total) by mouth daily.    No Known Allergies  Social History   Social History  . Marital status: Married    Spouse name: N/A  . Number of children: N/A  . Years of education: N/A   Social History Main Topics  . Smoking status: Never Smoker  . Smokeless tobacco: Current User  .  Alcohol use Yes     Comment: SOCIAL  . Drug use: No  . Sexual activity: Not Asked   Other Topics Concern  . None   Social History Narrative  . None    family history includes Breast cancer in his mother; Dementia in his mother; Diabetes in his mother; Heart attack in his paternal grandfather and  paternal uncle; Hyperlipidemia in his father and mother; Hypertension in his father.  Wt Readings from Last 3 Encounters:  10/20/16 197 lb 3.2 oz (89.4 kg)  06/20/16 195 lb 6.4 oz (88.6 kg)  10/05/15 180 lb 3.2 oz (81.7 kg)    PHYSICAL EXAM BP 122/88   Pulse 73   Ht 6\' 1"  (1.854 m)   Wt 197 lb 3.2 oz (89.4 kg)   BMI 26.02 kg/m  General appearance: alert, cooperative, appears stated age, no distress. Well nourished; somewhat disheveled (unbathed & smells of pet dander) - not of cigarettes  HEENT: [place]/AT, EOMI, MMM, anicteric sclera - wearing glasses (that are quite filthy); no carotid bruit and no JVD Lungs: mostly CTA-B with occasional expiratory wheeze. No rales or rhonchi. Nonlabored. Heart: regular rate and rhythm, S1 &S2 normal, no murmur, click, rub or gallop; nondisplaced PMI Neurologic: Mental status: Alert & oriented x 3, thought content appropriate; non-focal exam.  Pleasant mood & affect.   Adult ECG Report  Rate: 73 ;  Rhythm: normal sinus rhythm and Normal axis, intervals and durations;   Narrative Interpretation:  Normal EKG  Other studies Reviewed: Additional studies/ records that were reviewed today include:  Recent Labs:   Lab Results  Component Value Date   CHOL 201 (H) 06/20/2016   HDL 31 (L) 06/20/2016   LDLCALC 106 (H) 06/20/2016   TRIG 319 (H) 06/20/2016   CHOLHDL 6.5 (H) 06/20/2016    ASSESSMENT / PLAN: Problem List Items Addressed This Visit    Coronary artery disease, non-occlusive - Primary (Chronic)     Very minimal CAD by cardiac catheterization. It would seem like his chest discomfort of the time was either related to GERD or could be his asthma related episodes.  He is now on HCTZ for blood pressure and pravastatin for lipids. Both being managed by PCP. He is indicated that he would like to continue to follow-up with me, but I think he remained stable and I will defer most management to his PCP who he sees more frequently.      Relevant Orders    EKG 12-Lead   Dyslipidemia, goal LDL below 100 (Chronic)     About the importance of dietary modification and increasing exercise. He is on pravastatin. Last set of labs are not quite at goal, depending on duration of how long he has been on pravastatin, may want to consider increasing intensity of statin, changing to atorvastatin from pravastatin. Would defer to PCP.      Essential hypertension (Chronic)     Blood pressure looks pretty good today. On my recheck was 122/88 not 98. Appears to be well controlled with HCTZ. Managed by PCP      Relevant Orders   EKG 12-Lead      Current medicines are reviewed at length with the patient today. (+/- concerns) n/a The following changes have been made: n/a  Patient Instructions  NO CHANGES WITH CURRENT MEDICATIONS      Your physician wants you to follow-up in 12 MONTHS WITH DR Hesper Venturella. IF YOU FEEL OKAY CAN RETURN IN 2 YEARS INSTEAD.You will receive a reminder letter in  the mail two months in advance. If you don't receive a letter, please call our office to schedule the follow-up appointment.    If you need a refill on your cardiac medications before your next appointment, please call your pharmacy.    Studies Ordered:   Orders Placed This Encounter  Procedures  . EKG 12-Lead      Bryan Lemma, M.D., M.S. Interventional Cardiologist   Pager # [phone] Phone # [phone] [address] [place], [place] [address]

## 2016-10-22 ENCOUNTER — Encounter: Payer: Self-pay | Admitting: Cardiology

## 2016-10-22 DIAGNOSIS — E785 Hyperlipidemia, unspecified: Secondary | ICD-10-CM | POA: Insufficient documentation

## 2016-10-22 NOTE — Assessment & Plan Note (Signed)
Very minimal CAD by cardiac catheterization. It would seem like his chest discomfort of the time was either related to GERD or could be his asthma related episodes.  He is now on HCTZ for blood pressure and pravastatin for lipids. Both being managed by PCP. He is indicated that he would like to continue to follow-up with me, but I think he remained stable and I will defer most management to his PCP who he sees more frequently.

## 2016-10-22 NOTE — Assessment & Plan Note (Signed)
Blood pressure looks pretty good today. On my recheck was 122/88 not 98. Appears to be well controlled with HCTZ. Managed by PCP

## 2016-10-22 NOTE — Assessment & Plan Note (Signed)
About the importance of dietary modification and increasing exercise. He is on pravastatin. Last set of labs are not quite at goal, depending on duration of how long he has been on pravastatin, may want to consider increasing intensity of statin, changing to atorvastatin from pravastatin. Would defer to PCP.

## 2017-03-15 DIAGNOSIS — R3129 Other microscopic hematuria: Secondary | ICD-10-CM | POA: Diagnosis not present

## 2017-06-25 DIAGNOSIS — H5213 Myopia, bilateral: Secondary | ICD-10-CM | POA: Diagnosis not present

## 2017-06-25 DIAGNOSIS — H52223 Regular astigmatism, bilateral: Secondary | ICD-10-CM | POA: Diagnosis not present

## 2017-06-25 DIAGNOSIS — H524 Presbyopia: Secondary | ICD-10-CM | POA: Diagnosis not present

## 2019-08-18 ENCOUNTER — Ambulatory Visit (INDEPENDENT_AMBULATORY_CARE_PROVIDER_SITE_OTHER): Payer: BLUE CROSS/BLUE SHIELD | Admitting: Cardiology

## 2019-08-18 ENCOUNTER — Encounter: Payer: Self-pay | Admitting: Cardiology

## 2019-08-18 ENCOUNTER — Other Ambulatory Visit: Payer: Self-pay

## 2019-08-18 VITALS — BP 142/90 | HR 78 | Temp 98.1°F | Ht 73.0 in | Wt 200.8 lb

## 2019-08-18 DIAGNOSIS — I10 Essential (primary) hypertension: Secondary | ICD-10-CM

## 2019-08-18 DIAGNOSIS — I2510 Atherosclerotic heart disease of native coronary artery without angina pectoris: Secondary | ICD-10-CM

## 2019-08-18 DIAGNOSIS — E785 Hyperlipidemia, unspecified: Secondary | ICD-10-CM

## 2019-08-18 NOTE — Patient Instructions (Signed)
Medication Instructions:  No changes *If you need a refill on your cardiac medications before your next appointment, please call your pharmacy*   Lab Work: Not needed   Testing/Procedures: Not needed   Follow-Up: At [facility], you and your health needs are our priority.  As part of our continuing mission to provide you with exceptional heart care, we have created designated Provider Care Teams.  These Care Teams include your primary Cardiologist (physician) and Advanced Practice Providers (APPs -  Physician Assistants and Nurse Practitioners) who all work together to provide you with the care you need, when you need it.    Your next appointment:   24 month(s) 2023  The format for your next appointment:   In Person  Provider:   Bryan Lemma, MD   Other Instructions  have primary to monitor your blood pressures

## 2019-08-18 NOTE — Progress Notes (Signed)
Primary Care Provider: Maren Reamer, MD (Inactive) Cardiologist: No primary care provider on file. Electrophysiologist: None  Clinic Note: Chief Complaint  Patient presents with  . Follow-up    No major complaints  . Hypertension    Borderline control.   HPI:    Walter Griffin is a 49 y.o. male with a PMH notable for reactive airway disease exacerbated by allergies, hypertension and hyperlipidemia with history of false activation of STEMI (mild LAD disease but otherwise normal) who presents today for 3-year follow-up  Walter Griffin was last seen in May 2018 as an annual follow-up from when he came in for his heart catheterization.  Was doing well from a cardiac standpoint.  Only noted some chest tightness with asthma.  Usually relieved with antihistamine and inhaler. PCP recently started HCTZ and Pravachol.  Recent Hospitalizations: None  Reviewed  CV studies:    The following studies were reviewed today: (if available, images/films reviewed: From Epic Chart or Care Everywhere) . None:   Interval History:   Walter Griffin returns here today as a call back visit nearing 3 years from his last visit, and 4 years from his STEMI activation.  He notes that he is been relatively stable.  He gets short of breath with modest exertion.  Usually if he is rushing to go up a hill or up stairs will definitely get short of breath, but otherwise really is without any major cardiac complaints.  He has not had any further symptoms that suggested angina. He notes that his blood pressure had gone back up again, so he was restarted on both HCTz, but had stopped taking his pravastatin. He has some congestion which makes him a little short of breath that probably contributes more to some exertional dyspnea and ADLs.  He denies any chest pain or pressure.  No heart failure symptoms of PND or orthopnea.  Trace edema.  CV Review of Symptoms (Summary): positive for - Exertional dyspnea with  vigorous exertion. negative for - edema, irregular heartbeat, orthopnea, palpitations, paroxysmal nocturnal dyspnea, rapid heart rate, shortness of breath or Syncope/near syncope, TIA/amaurosis fugax, claudication  The patient does not have symptoms concerning for COVID-19 infection (fever, chills, cough, or new shortness of breath).  The patient is practicing social distancing & Masking.    REVIEWED OF SYSTEMS   Review of Systems  HENT: Positive for congestion.   Respiratory: Positive for shortness of breath and wheezing.        Related to seasonal allergies.  Musculoskeletal: Positive for joint pain.  Endo/Heme/Allergies: Positive for environmental allergies.   Allergies with wheezing and shortness of breath. Back pain with sciatic radiculopathy.  I have reviewed and (if needed) personally updated the patient's problem list, medications, allergies, past medical and surgical history, social and family history.   PAST MEDICAL HISTORY   Past Medical History:  Diagnosis Date  . GERD (gastroesophageal reflux disease)    Symptoms are concerning for possible angina. Was taken to Cath Lab. Treated with Protonix after hisCADnoted.  . H/O seasonal allergies   . Sinus drainage     PAST SURGICAL HISTORY   Past Surgical History:  Procedure Laterality Date  . CARDIAC CATHETERIZATION N/A 09/02/2015   Procedure: Left Heart Cath and Coronary Angiography;  Surgeon: Leonie Man, MD;  Location: [facility];  Service: Cardiovascular;  ostial LAD 35% stenosis at a bend point. Normal EF. Normal EDP.    MEDICATIONS/ALLERGIES   Current Meds  Medication Sig  .  hydrochlorothiazide (HYDRODIURIL) 25 MG tablet Take 1 tablet (25 mg total) by mouth daily.  Marland Kitchen ibuprofen (ADVIL,MOTRIN) 200 MG tablet Take 400 mg by mouth every 6 (six) hours as needed (pain).  Marland Kitchen loratadine (CLARITIN) 10 MG tablet Take 10 mg by mouth daily as needed for allergies.  . pravastatin (PRAVACHOL) 20 MG tablet Take 1  tablet (20 mg total) by mouth daily.  - Not taking standing HCTZ & Pravachol -- takes PRN loratadine.   No Known Allergies  SOCIAL HISTORY/FAMILY HISTORY   Reviewed in Epic:  Pertinent findings: 4 JOBs. 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 He appears well-developed and well-nourished. No distress.  HENT:  Head: Atraumatic.  Neck: No hepatojugular reflux and no JVD present. Carotid bruit is not present.  Cardiovascular: Normal rate, regular rhythm, normal heart sounds and intact distal pulses.  No extrasystoles are present. PMI is not displaced. Exam reveals no gallop and no friction rub.  No murmur heard. Pulmonary/Chest: Effort normal and breath sounds normal. No respiratory distress. He has no wheezes. He has no rales.  Abdominal: Soft. Bowel sounds are normal. He exhibits no distension. There is no abdominal tenderness. There is no rebound.  Musculoskeletal:        General: Edema present. Normal range of motion.     Cervical back: Normal range of motion and neck supple.  Neurological: He is alert and oriented to person, place, and time.  Psychiatric: He has a normal mood and affect. His behavior is normal. Judgment and thought content normal.  Vitals reviewed.    Adult ECG Report  Rate: 78;  Rhythm: normal sinus rhythm and Voltage criteria for LVH.  Otherwise normal axis, intervals and durations.;   Narrative Interpretation: Relatively normal EKG  Recent Labs:  Lab Results  Component Value  Date   CHOL 201 (H) 06/20/2016   HDL 31 (L) 06/20/2016   LDLCALC 106 (H) 06/20/2016   TRIG 319 (H) 06/20/2016   CHOLHDL 6.5 (H) 06/20/2016   Lab Results  Component Value Date   CREATININE 1.04 06/20/2016   BUN 16 06/20/2016   NA 136 06/20/2016   K 4.0 06/20/2016   CL 102 06/20/2016   CO2 23 06/20/2016   Lab Results  Component Value Date   TSH 0.728 09/03/2015    ASSESSMENT/PLAN   He remains overall stable from a cardiac standpoint.  I do think it is reasonable to have him back on a statin based on lipids checked in 2018.  No further chest pain that was similar to what was there is reason for admission as a possible STEMI.  Plan: Continue statin but no beta-blocker for CAD given low heart rate. Monitor for recurrent episodes of chest pain that could be considered spasm, treat accordingly. Overall he is doing fairly well.  Not sleeping that well. Swelling and shortness of breath  improved with HCTZ.  Need to monitor follow-up labs.  Problem List Items Addressed This Visit    Coronary artery disease, non-occlusive (Chronic)   Dyslipidemia, goal LDL below 100 - Primary (Chronic)   Essential hypertension (Chronic)      COVID-19 Education: The signs and symptoms of COVID-19 were discussed with the patient and how to seek care for testing (follow up with PCP or arrange E-visit).   The importance of social distancing was discussed today.  I spent a total of with the patient. >  50% of the time was spent in direct patient consultation.  Additional time spent with chart review  / charting (studies, outside notes, etc): 6 Total Time: 28 min   Current medicines are reviewed at length with the patient today.  (+/- concerns) had not been taking either pravastatin or HCTZ up until recently.  His PCP recently started back on HCTZ.  For high blood pressure.  Notice: This dictation was prepared with Dragon dictation along with smaller phrase technology. Any transcriptional  errors that result from this process are unintentional and may not be corrected upon review.  Patient Instructions / Medication Changes & Studies & Tests Ordered   Patient Instructions  Medication Instructions:  No changes *If you need a refill on your cardiac medications before your next appointment, please call your pharmacy*   Lab Work: Not needed   Testing/Procedures: Not needed   Follow-Up: At [facility], you and your health needs are our priority.  As part of our continuing mission to provide you with exceptional heart care, we have created designated Provider Care Teams.  These Care Teams include your primary Cardiologist (physician) and Advanced Practice Providers (APPs -  Physician Assistants and Nurse Practitioners) who all work together to provide you with the care you need, when you need it.    Your next appointment:   24 month(s) 2023  The format for your next appointment:   In Person  Provider:   Bryan Lemma, MD   Other Instructions  have primary to monitor your blood pressures   Studies Ordered:   No orders of the defined types were placed in this encounter.    Bryan Lemma, M.D., M.S. Interventional Cardiologist   Pager # [phone] Phone # [phone] [address] [place], [place] [address]   Thank you for choosing Heartcare at [facility]!!

## 2019-08-20 ENCOUNTER — Encounter: Payer: Self-pay | Admitting: Cardiology

## 2019-08-21 ENCOUNTER — Other Ambulatory Visit (INDEPENDENT_AMBULATORY_CARE_PROVIDER_SITE_OTHER): Payer: Self-pay

## 2019-08-21 DIAGNOSIS — I10 Essential (primary) hypertension: Secondary | ICD-10-CM

## 2019-10-17 DIAGNOSIS — H524 Presbyopia: Secondary | ICD-10-CM | POA: Diagnosis not present

## 2019-10-17 DIAGNOSIS — H52223 Regular astigmatism, bilateral: Secondary | ICD-10-CM | POA: Diagnosis not present

## 2019-10-17 DIAGNOSIS — H5213 Myopia, bilateral: Secondary | ICD-10-CM | POA: Diagnosis not present

## 2021-06-16 ENCOUNTER — Telehealth: Payer: Self-pay | Admitting: Cardiology

## 2021-06-16 NOTE — Telephone Encounter (Signed)
°  Per Mychart scheduling message:  Pt c/o of Chest Pain: STAT if CP now or developed within 24 hours  1. Are you having CP right now? no  2. Are you experiencing any other symptoms (ex. SOB, nausea, vomiting, sweating)? Shortness of breath  3. How long have you been experiencing CP? 2 days  4. Is your CP continuous or coming and going? Comes and goes  5. Have you taken Nitroglycerin? no ?

## 2021-06-16 NOTE — Telephone Encounter (Signed)
Returned call to pt he states that Monday he had "come chest pressure". 8/10, that lasted "couple hours". Hid BP was 150/100 HR 80. He states that he had a "bounding pulse". This started when he was lying dow so he sat up and went to the "other room and it went felt much better/ He has the [place] mobile and it said that he had normal sinus rhythm. At that time the CP was 3-4/10. So he called 911 and the EMT's came. He states that he had previously injured his "rib area" and the EMT's told him he just probably exacerbated this injury, or maybe just had indigestion. So when they left the pressure was alleviated, he thinks because the pressure was stressing him out and they made him feel better. He states that the next day it was sore in the area that this pressure was happening.  I have scheduled pt next next available appt for his bi-annual appt. I will forward message to Dr Herbie Baltimore for his review.

## 2021-06-17 NOTE — Telephone Encounter (Signed)
Housing and probably was MSK related pain.  If we can get him in a little sooner than originally scheduled, that would work.  Bryan Lemma, MD

## 2021-06-17 NOTE — Telephone Encounter (Signed)
Pt informed of providers result. Pt verbalized understanding. Scheduled an appt with Wynema Birch next week to discuss. He will arrive early for check in. No further questions .

## 2021-06-21 NOTE — Progress Notes (Signed)
Office Visit    Patient Name: Walter Griffin Date of Encounter: 06/22/2021  PCP:  Maren Reamer, MD (Inactive)   [place]  Cardiologist:  Glenetta Hew, MD  Advanced Practice Provider:  No care team member to display Electrophysiologist:  None    HPI    Walter Griffin is a 51 y.o. male with a hx of reactive airway disease exacerbated by allergies, hypertension, hyperlipidemia, history of false activation of STEMI (mild LAD disease but otherwise normal) who presents for annual follow-up.    He was seen back in March 2021 after not being seen for nearly 3 years.  He noted at that time being relatively stable.  He did occasionally get short of breath with modest exertion.  Usually his shortness of breath would occur if he was rushing to go uphill or stairs, but otherwise he was without cardiac complaints.  He had not had any further symptoms to suggest angina.  He notes his blood pressure had increased so he was restarted on HCTZ, but had stopped his pravastatin.  January 16th the patient called a few days ago and stated that he had some chest pressure which she rated an 8 out of 10 that lasted a couple hours.  Blood pressure was 150/100, heart rate 80.  He stated that his pulse was bounding.  He called 911 and the EMTs came. Thought to be indigestion.  When they left the pressure was alleviated.  He states the following day that his chest was sore.  He requested a follow-up appointment.  Today, he states that this past Monday he experienced chest pressure that came on randomly when laying down.  He did endorse having some potato chips with ranch before that time.  However, he explains that the chest pressure was worse than what he experienced back in 2017.  He called EMS and his EKG did not show any changes.  He chose not to be transported to the hospital.  Due to his risk factors of hypertension and hyperlipidemia we discussed pursuing an ischemic work-up and  the patient agreed.  He also is hypertensive today on exam.  Blood pressure is 148/82.  He states that at home it his blood pressure is usually in the [id number] systolic.  Diastolic has been as high as 112 mmHg.  We discussed starting an antihypertensive medication since patient is not currently taking one.  He works as an Nurse, children's and would prefer to follow-up in [place] closer to where he lives.  Reports no shortness of breath nor dyspnea on exertion.No edema, orthopnea, PND. Reports no palpitations.    Past Medical History    Past Medical History:  Diagnosis Date   GERD (gastroesophageal reflux disease)    Symptoms are concerning for possible angina. Was taken to Cath Lab. Treated with Protonix after hisCADnoted.   H/O seasonal allergies    Sinus drainage    Past Surgical History:  Procedure Laterality Date   CARDIAC CATHETERIZATION N/A 09/02/2015   Procedure: Left Heart Cath and Coronary Angiography;  Surgeon: Leonie Man, MD;  Location: [facility];  Service: Cardiovascular;  ostial LAD 35% stenosis at a bend point. Normal EF. Normal EDP.    Allergies  No Known Allergies    EKGs/Labs/Other Studies Reviewed:   The following studies were reviewed today:  09/02/2015 Left heart cath and coronary angiography  Ost LAD lesion, 35% stenosed. The left ventricular systolic function is normal. Normal LVEDP  Unclear etiology for the patient's chest pain and mild subtle EKG changes. But clearly no obstructive CAD noted on angiography.   Plan: Admit overnight in telemetry unit to follow-up echocardiogram to exclude possible pericarditis as etiology. Consider treating for GERD     Would expect the patient will be stable for discharge tomorrow if chest pain free. Could consider outpatient evaluation for possible GI etiology.  EKG:  EKG is  ordered today.  The ekg ordered today demonstrates NSR, rate 76 bpm  Recent Labs: No results found for requested labs within  last 8760 hours.  Recent Lipid Panel    Component Value Date/Time   CHOL 201 (H) 06/20/2016 1543   TRIG 319 (H) 06/20/2016 1543   HDL 31 (L) 06/20/2016 1543   CHOLHDL 6.5 (H) 06/20/2016 1543   VLDL 64 (H) 06/20/2016 1543   LDLCALC 106 (H) 06/20/2016 1543    Home Medications   Current Meds  Medication Sig   ibuprofen (ADVIL,MOTRIN) 200 MG tablet Take 400 mg by mouth every 6 (six) hours as needed (pain).   loratadine (CLARITIN) 10 MG tablet Take 10 mg by mouth daily as needed for allergies.     Review of Systems      All other systems reviewed and are otherwise negative except as noted above.  Physical Exam    VS:  BP (!) 148/82    Pulse 76    Ht 6\' 1"  (1.854 m)    Wt 204 lb 9.6 oz (92.8 kg)    SpO2 95%    BMI 26.99 kg/m  , BMI Body mass index is 26.99 kg/m.  Wt Readings from Last 3 Encounters:  06/22/21 204 lb 9.6 oz (92.8 kg)  08/18/19 200 lb 12.8 oz (91.1 kg)  10/20/16 197 lb 3.2 oz (89.4 kg)     GEN: Well nourished, well developed, in no acute distress. HEENT: normal. Neck: Supple, no JVD, carotid bruits, or masses. Cardiac: RRR, no murmurs, rubs, or gallops. No clubbing, cyanosis, edema.  Radials/PT 2+ and equal bilaterally.  Respiratory:  Respirations regular and unlabored, clear to auscultation bilaterally. GI: Soft, nontender, nondistended. MS: No deformity or atrophy. Skin: Warm and dry, no rash. Neuro:  Strength and sensation are intact. Psych: Normal affect.  Assessment & Plan    CAD, nonocclusive -Last cardiac catheterization performed 2017 showed ostial LAD lesion of 35% stenosis but no other CAD. He did have ST elevation at this time.  Chest pain --Atypical chest pain that is nonexertional occurred this past Monday.  EMS was called and no significant EKG changes were found.  Blood pressure was elevated. -He states that the pain he experienced this past Monday was worse than the pain that he experienced back in 2017. It was described as chest  pressure -We will order a cardiac CTA to rule out ischemic disease  Hyperlipidemia, LDL goal less than 70 -Ordered an updated Lipid panel -Last lipid panel (2018)  showed total cholesterol 201, LDL 106, HDL 31, and triglycerides 319  Chronic essential hypertension -Not currently on any antihypertensives -We will initiate lisinopril 5 mg daily -I have asked patient to check his blood pressure once a day and record for 2 weeks.  He can send these values to me via MyChart    Disposition: Follow up 1 month with Glenetta Hew, MD or APP.  Signed, Elgie Collard, PA-C 06/22/2021, 12:03 PM [facility]

## 2021-06-22 ENCOUNTER — Encounter: Payer: Self-pay | Admitting: Physician Assistant

## 2021-06-22 ENCOUNTER — Other Ambulatory Visit: Payer: Self-pay

## 2021-06-22 ENCOUNTER — Ambulatory Visit: Payer: BC Managed Care – PPO | Admitting: Physician Assistant

## 2021-06-22 VITALS — BP 148/82 | HR 76 | Ht 73.0 in | Wt 204.6 lb

## 2021-06-22 DIAGNOSIS — E785 Hyperlipidemia, unspecified: Secondary | ICD-10-CM | POA: Diagnosis not present

## 2021-06-22 DIAGNOSIS — I10 Essential (primary) hypertension: Secondary | ICD-10-CM | POA: Diagnosis not present

## 2021-06-22 DIAGNOSIS — R079 Chest pain, unspecified: Secondary | ICD-10-CM

## 2021-06-22 DIAGNOSIS — I25118 Atherosclerotic heart disease of native coronary artery with other forms of angina pectoris: Secondary | ICD-10-CM | POA: Diagnosis not present

## 2021-06-22 MED ORDER — LISINOPRIL 5 MG PO TABS: 5.0000 mg | ORAL_TABLET | Freq: Every day | ORAL | 1 refills | Status: DC

## 2021-06-22 MED ORDER — METOPROLOL TARTRATE 100 MG PO TABS: 100.0000 mg | ORAL_TABLET | Freq: Once | ORAL | 0 refills | Status: DC

## 2021-06-22 NOTE — Patient Instructions (Addendum)
Medication Instructions:  Start Lisinopril 5 mg daily   *If you need a refill on your cardiac medications before your next appointment, please call your pharmacy*   Lab Work Your physician recommends that you return for lab work a your earliest convenience   Fasting Lipid panel   If you have labs (blood work) drawn today and your tests are completely normal, you will receive your results only by: MyChart Message (if you have MyChart) OR A paper copy in the mail If you have any lab test that is abnormal or we need to change your treatment, we will call you to review the results.   Testing/Procedures: Your physician has requested that you have cardiac CT. Cardiac computed tomography (CT) is a painless test that uses Griffin x-ray machine to take clear, detailed pictures of your heart. For further information please visit [email or web address] Please follow instruction sheet as given.     Follow-Up: At [facility], you and your health needs are our priority.  As part of our continuing mission to provide you with exceptional heart care, we have created designated Provider Care Teams.  These Care Teams include your primary Cardiologist (physician) and Advanced Practice Providers (APPs -  Physician Assistants and Nurse Practitioners) who all work together to provide you with the care you need, when you need it.  We recommend signing up for the patient portal called "MyChart".  Sign up information is provided on this After Visit Summary.  MyChart is used to connect with patients for Virtual Visits (Telemedicine).  Patients are able to view lab/test results, encounter notes, upcoming appointments, etc.  Non-urgent messages can be sent to your provider as well.   To learn more about what you can do with MyChart, go to [email or web address].    Your next appointment:   1 month(s)  The format for your next appointment:   In Person  Provider:   You may see Walter Lemma, MD or one of the  following Advanced Practice Providers on your designated Care Team:   Walter An, PA-C  Walter Griffin, [place]     Other Instructions Please check and record blood pressure once daily x 2 weeks on blood pressure log. Send values to mychart when you've recorded blood pressure for 2 weeks.     Your cardiac CT will be scheduled at one of the below locations:   [facility] [address] [place], [place] [address] [phone]  OR  [facility] [address] [place], [place] [address] [phone]  If scheduled at [facility], please arrive at the [facility] main entrance (entrance A) of [facility] 30 minutes prior to test start time. You can use the FREE valet parking offered at the main entrance (encouraged to control the heart rate for the test) Proceed to the [facility] Radiology Department (first floor) to check-in and test prep.  If scheduled at [facility], please arrive 15 mins early for check-in and test prep.  Please follow these instructions carefully (unless otherwise directed):  Hold all erectile dysfunction medications at least 3 days (72 hrs) prior to test.  On the Night Before the Test: Be sure to Drink plenty of water. Do not consume any caffeinated/decaffeinated beverages or chocolate 12 hours prior to your test. Do not take any antihistamines 12 hours prior to your test. If the patient has contrast allergy: Patient will need a prescription for Prednisone and very clear instructions (as follows): Prednisone  50 mg - take 13 hours prior to test Take another Prednisone 50 mg 7 hours prior to test Take another Prednisone 50 mg 1 hour prior to test Take Benadryl 50 mg 1 hour prior to test Patient must complete all four doses of above prophylactic medications. Patient will need a ride after test due to Benadryl.  On the Day of the Test: Drink plenty of  water until 1 hour prior to the test. Do not eat any food 4 hours prior to the test. You may take your regular medications prior to the test.  Take metoprolol (Lopressor) two hours prior to test. HOLD Furosemide/Hydrochlorothiazide morning of the test. FEMALES- please wear underwire-free bra if available, avoid dresses & tight clothing   *For Clinical Staff only. Please instruct patient the following:* Heart Rate Medication Recommendations for Cardiac CT  Resting HR < 50 bpm  No medication  Resting HR 50-60 bpm and BP >110/50 mmHG   Consider Metoprolol tartrate 25 mg PO 90-120 min prior to scan  Resting HR 60-65 bpm and BP >110/50 mmHG  Metoprolol tartrate 50 mg PO 90-120 minutes prior to scan   Resting HR > 65 bpm and BP >110/50 mmHG  Metoprolol tartrate 100 mg PO 90-120 minutes prior to scan  Consider Ivabradine 10-15 mg PO or a calcium channel blocker for resting HR >60 bpm and contraindication to metoprolol tartrate  Consider Ivabradine 10-15 mg PO in combination with metoprolol tartrate for HR >80 bpm         After the Test: Drink plenty of water. After receiving IV contrast, you may experience a mild flushed feeling. This is normal. On occasion, you may experience a mild rash up to 24 hours after the test. This is not dangerous. If this occurs, you can take Benadryl 25 mg and increase your fluid intake. If you experience trouble breathing, this can be serious. If it is severe call 911 IMMEDIATELY. If it is mild, please call our office. If you take any of these medications: Glipizide/Metformin, Avandament, Glucavance, please do not take 48 hours after completing test unless otherwise instructed.  We will call to schedule your test 2-4 weeks out understanding that some insurance companies will need Griffin authorization prior to the service being performed.   For non-scheduling related questions, please contact the cardiac imaging nurse navigator should you have any  questions/concerns: Rockwell Alexandria, Cardiac Imaging Nurse Navigator Larey Brick, Cardiac Imaging Nurse Navigator  Heart and Vascular Services Direct Office Dial: [phone]   For scheduling needs, including cancellations and rescheduling, please call [place], [phone].

## 2021-06-22 NOTE — Addendum Note (Signed)
Addended by: Lamar Benes on: 06/22/2021 12:21 PM   Modules accepted: Orders

## 2021-07-01 DIAGNOSIS — E785 Hyperlipidemia, unspecified: Secondary | ICD-10-CM | POA: Diagnosis not present

## 2021-07-02 LAB — LIPID PANEL
Chol/HDL Ratio: 5.2 ratio — ABNORMAL HIGH (ref 0.0–5.0)
Cholesterol, Total: 173 mg/dL (ref 100–199)
HDL: 33 mg/dL — ABNORMAL LOW (ref >39)
LDL Chol Calc (NIH): 95 mg/dL (ref 0–99)
Triglycerides: 266 mg/dL — ABNORMAL HIGH (ref 0–149)
VLDL Cholesterol Cal: 45 mg/dL — ABNORMAL HIGH (ref 5–40)

## 2021-07-04 ENCOUNTER — Other Ambulatory Visit: Payer: Self-pay

## 2021-07-04 DIAGNOSIS — E785 Hyperlipidemia, unspecified: Secondary | ICD-10-CM

## 2021-07-04 MED ORDER — ICOSAPENT ETHYL 1 G PO CAPS: 2.0000 g | ORAL_CAPSULE | Freq: Two times a day (BID) | ORAL | 1 refills | Status: AC

## 2021-07-04 MED ORDER — PRAVASTATIN SODIUM 40 MG PO TABS: 40.0000 mg | ORAL_TABLET | Freq: Every day | ORAL | 1 refills | Status: DC

## 2021-07-05 ENCOUNTER — Other Ambulatory Visit: Payer: Self-pay | Admitting: *Deleted

## 2021-07-05 DIAGNOSIS — E785 Hyperlipidemia, unspecified: Secondary | ICD-10-CM

## 2021-07-18 DIAGNOSIS — Z1389 Encounter for screening for other disorder: Secondary | ICD-10-CM | POA: Diagnosis not present

## 2021-07-18 DIAGNOSIS — Z0130 Encounter for examination of blood pressure without abnormal findings: Secondary | ICD-10-CM | POA: Diagnosis not present

## 2021-07-18 DIAGNOSIS — Z0000 Encounter for general adult medical examination without abnormal findings: Secondary | ICD-10-CM | POA: Diagnosis not present

## 2021-07-18 DIAGNOSIS — F17200 Nicotine dependence, unspecified, uncomplicated: Secondary | ICD-10-CM | POA: Diagnosis not present

## 2021-07-18 DIAGNOSIS — I10 Essential (primary) hypertension: Secondary | ICD-10-CM | POA: Diagnosis not present

## 2021-07-21 ENCOUNTER — Telehealth (HOSPITAL_COMMUNITY): Payer: Self-pay | Admitting: Emergency Medicine

## 2021-07-21 DIAGNOSIS — I25118 Atherosclerotic heart disease of native coronary artery with other forms of angina pectoris: Secondary | ICD-10-CM

## 2021-07-21 MED ORDER — METOPROLOL TARTRATE 100 MG PO TABS: 100.0000 mg | ORAL_TABLET | Freq: Once | ORAL | 0 refills | Status: DC

## 2021-07-21 NOTE — Telephone Encounter (Signed)
Reaching out to patient to offer assistance regarding upcoming cardiac imaging study; pt verbalizes understanding of appt date/time, parking situation and where to check in, pre-test NPO status and medications ordered, and verified current allergies; name and call back number provided for further questions should they arise Rockwell Alexandria RN Navigator Cardiac Imaging [facility] [phone] office [phone] cell  Pt reminded to pick up 100mg  metoprolol tartrate  Holding HCTZ Denies iv issues Arrival 130

## 2021-07-21 NOTE — Telephone Encounter (Signed)
Patient sending message. Can not afford test what are other options?

## 2021-07-22 ENCOUNTER — Other Ambulatory Visit: Payer: Self-pay

## 2021-07-22 ENCOUNTER — Ambulatory Visit (HOSPITAL_COMMUNITY)
Admission: RE | Admit: 2021-07-22 | Discharge: 2021-07-22 | Disposition: A | Discharge status: Home / Self Care | Payer: BC Managed Care – PPO | Source: Ambulatory Visit | Attending: Physician Assistant | Admitting: Physician Assistant

## 2021-07-22 DIAGNOSIS — R079 Chest pain, unspecified: Secondary | ICD-10-CM | POA: Insufficient documentation

## 2021-07-22 HISTORY — PX: CT CTA CORONARY W/CA SCORE W/CM &/OR WO/CM: HXRAD787

## 2021-07-22 MED ORDER — NITROGLYCERIN 0.4 MG SL SUBL
0.8000 mg | SUBLINGUAL_TABLET | Freq: Once | SUBLINGUAL | Status: AC
Start: 2021-07-22 — End: 2021-07-22
  Administered 2021-07-22: 0.8 mg via SUBLINGUAL

## 2021-07-22 MED ORDER — NITROGLYCERIN 0.4 MG SL SUBL
SUBLINGUAL_TABLET | SUBLINGUAL | Status: AC
  Filled 2021-07-22: qty 2

## 2021-07-22 MED ORDER — IOHEXOL 350 MG/ML SOLN
95.0000 mL | Freq: Once | INTRAVENOUS | Status: AC | PRN
  Administered 2021-07-22: 95 mL via INTRAVENOUS

## 2021-07-28 ENCOUNTER — Other Ambulatory Visit: Payer: Self-pay

## 2021-07-28 DIAGNOSIS — Q2112 Patent foramen ovale: Secondary | ICD-10-CM

## 2021-07-28 MED ORDER — ASPIRIN EC 81 MG PO TBEC: 81.0000 mg | DELAYED_RELEASE_TABLET | Freq: Every day | ORAL | 3 refills | Status: AC

## 2021-08-03 ENCOUNTER — Other Ambulatory Visit: Payer: Self-pay

## 2021-08-03 ENCOUNTER — Encounter: Payer: Self-pay | Admitting: Physician Assistant

## 2021-08-03 ENCOUNTER — Ambulatory Visit: Payer: BC Managed Care – PPO | Admitting: Physician Assistant

## 2021-08-03 VITALS — BP 130/84 | HR 86 | Ht 73.0 in | Wt 203.8 lb

## 2021-08-03 DIAGNOSIS — I10 Essential (primary) hypertension: Secondary | ICD-10-CM | POA: Diagnosis not present

## 2021-08-03 DIAGNOSIS — R0789 Other chest pain: Secondary | ICD-10-CM

## 2021-08-03 DIAGNOSIS — Q2112 Patent foramen ovale: Secondary | ICD-10-CM | POA: Diagnosis not present

## 2021-08-03 DIAGNOSIS — E785 Hyperlipidemia, unspecified: Secondary | ICD-10-CM

## 2021-08-03 NOTE — Patient Instructions (Signed)
Medication Instructions:  ?The current medical regimen is effective;  continue present plan and medications. ? ?*If you need a refill on your cardiac medications before your next appointment, please call your pharmacy* ? ? ?Lab Work: ?CMET, LIPID 1-2 weeks (fasting, nothing to eat or drink, no lab appointment needed) ? ?If you have labs (blood work) drawn today and your tests are completely normal, you will receive your results only by: ?MyChart Message (if you have MyChart) OR ?A paper copy in the mail ?If you have any lab test that is abnormal or we need to change your treatment, we will call you to review the results. ? ? ?Testing/Procedures: ?ECHO with bubble.  ? ? ?Follow-Up: ?At [facility], you and your health needs are our priority.  As part of our continuing mission to provide you with exceptional heart care, we have created designated Provider Care Teams.  These Care Teams include your primary Cardiologist (physician) and Advanced Practice Providers (APPs -  Physician Assistants and Nurse Practitioners) who all work together to provide you with the care you need, when you need it. ? ?We recommend signing up for the patient portal called "MyChart".  Sign up information is provided on this After Visit Summary.  MyChart is used to connect with patients for Virtual Visits (Telemedicine).  Patients are able to view lab/test results, encounter notes, upcoming appointments, etc.  Non-urgent messages can be sent to your provider as well.   ?To learn more about what you can do with MyChart, go to [email or web address].   ? ?Your next appointment:   ?Keep scheduled with Dr.Harding. ? ?

## 2021-08-03 NOTE — Progress Notes (Signed)
Cardiology Office Note:    Date:  08/05/2021   ID:  Walter Griffin, DOB 10/29/1970, MRN [id number]  PCP:  Pete Glatter, MD (Inactive)   [facility] Providers Cardiologist:  Bryan Lemma, MD     Referring MD: No ref. provider found   Chief Complaint  Patient presents with   Follow-up    Seen for Dr. Herbie Baltimore    History of Present Illness:    Walter Griffin is a 51 y.o. male with a hx of reactive airway disease, hypertension, hyperlipidemia, and CAD.  Patient previously presented with chest pain in April 2017.  Code STEMI was activated, however cardiac catheterization on 09/02/2015 only revealed 30% ostial LAD lesion.  More recently, patient had an episode of chest discomfort on 06/13/2021, he called EMS, blood pressure was elevated at 150/100.  EMT felt it was indigestion.  EKG did not show any significant change.  He chose not to be transported to the hospital.  Patient was seen by Jari Favre PA-C on 06/22/2021 for follow-up.  Low-dose and lisinopril was added to his medical regimen.  Patient underwent coronary CT on 07/22/2021 which showed coronary calcium score was 0, small PFO with left-to-right flow, otherwise no significant sign of coronary artery disease.  Left greater than right gynecomastia noted on extracardiac CT imaging.  His triglyceride was very high, pravastatin was increased to 40 mg daily.  Patient presents today for follow-up.  He denies any recent chest pain and no shortness of breath.  Echocardiogram has been arranged for the next month prior to his follow-up with Dr. Herbie Baltimore.  I checked with Dr. Herbie Baltimore, we will add saline bubble study to echocardiogram.  He has no lower extremity edema, orthopnea or PND.  Overall he is doing quite well from the cardiac perspective.  He will need to CMP and fasting lipid panel in 1 to 2 weeks at local [facility].  He can keep his follow-up with Dr. Herbie Baltimore next month after the echocardiogram.  He is completely asymptomatic.  As long as  the PFO is not severely large, I think we can continue to observe the patient.   Past Medical History:  Diagnosis Date   GERD (gastroesophageal reflux disease)    Symptoms are concerning for possible angina. Was taken to Cath Lab. Treated with Protonix after hisCADnoted.   H/O seasonal allergies    Sinus drainage     Past Surgical History:  Procedure Laterality Date   CARDIAC CATHETERIZATION N/A 09/02/2015   Procedure: Left Heart Cath and Coronary Angiography;  Surgeon: Marykay Lex, MD;  Location: [facility] INVASIVE CV LAB;  Service: Cardiovascular;  ostial LAD 35% stenosis at a bend point. Normal EF. Normal EDP.    Current Medications: Current Meds  Medication Sig   aspirin EC 81 MG tablet Take 1 tablet (81 mg total) by mouth daily. Swallow whole.   escitalopram (LEXAPRO) 10 MG tablet Take 10 mg by mouth daily in the afternoon.   fluticasone (FLONASE) 50 MCG/ACT nasal spray Place 1 spray into both nostrils daily.   ibuprofen (ADVIL,MOTRIN) 200 MG tablet Take 400 mg by mouth every 6 (six) hours as needed (pain).   icosapent Ethyl (VASCEPA) 1 g capsule Take 2 capsules (2 g total) by mouth 2 (two) times daily.   lisinopril (ZESTRIL) 5 MG tablet Take 1 tablet (5 mg total) by mouth daily.   loratadine (CLARITIN) 10 MG tablet Take 10 mg by mouth daily as needed for allergies.   nicotine polacrilex (COMMIT) 4 MG  lozenge Take 4 mg by mouth.   pravastatin (PRAVACHOL) 40 MG tablet Take 1 tablet (40 mg total) by mouth daily.     Allergies:   Patient has no known allergies.   Social History   Socioeconomic History   Marital status: Widowed    Spouse name: Not on file   Number of children: Not on file   Years of education: Not on file   Highest education level: Not on file  Occupational History   Not on file  Tobacco Use   Smoking status: Never   Smokeless tobacco: Current  Substance and Sexual Activity   Alcohol use: Yes    Comment: SOCIAL   Drug use: No   Sexual activity: Not on  file  Other Topics Concern   Not on file  Social History Narrative   Not on file   Social Determinants of Health   Financial Resource Strain: Not on file  Food Insecurity: Not on file  Transportation Needs: Not on file  Physical Activity: Not on file  Stress: Not on file  Social Connections: Not on file     Family History: The patient's family history includes Breast cancer in his mother; Dementia in his mother; Diabetes in his mother; Heart attack in his paternal grandfather and paternal uncle; Hyperlipidemia in his father and mother; Hypertension in his father.  ROS:   Please see the history of present illness.     All other systems reviewed and are negative.  EKGs/Labs/Other Studies Reviewed:    The following studies were reviewed today:  Coronary CT 07/22/2021 IMPRESSION: 1. No evidence of CAD, CADRADS = 0.   2. Coronary calcium score of 0. This was 0 percentile for age and sex matched control.   3. Normal coronary origin with right dominance.   4. Small PFO with left to right flow suspected   5. Consider non-coronary causes of chest pain.  IMPRESSION: No acute findings in the imaged extracardiac chest.   Hepatic steatosis.   Left-greater-than-right gynecomastia.    EKG:  EKG is not ordered today.    Recent Labs: No results found for requested labs within last 8760 hours.  Recent Lipid Panel    Component Value Date/Time   CHOL 173 07/01/2021 0848   TRIG 266 (H) 07/01/2021 0848   HDL 33 (L) 07/01/2021 0848   CHOLHDL 5.2 (H) 07/01/2021 0848   CHOLHDL 6.5 (H) 06/20/2016 1543   VLDL 64 (H) 06/20/2016 1543   LDLCALC 95 07/01/2021 0848     Risk Assessment/Calculations:           Physical Exam:    VS:  BP 130/84    Pulse 86    Ht 6\' 1"  (1.854 m)    Wt 203 lb 12.8 oz (92.4 kg)    SpO2 97%    BMI 26.89 kg/m     Wt Readings from Last 3 Encounters:  08/03/21 203 lb 12.8 oz (92.4 kg)  06/22/21 204 lb 9.6 oz (92.8 kg)  08/18/19 200 lb 12.8 oz (91.1  kg)     GEN:  Well nourished, well developed in no acute distress HEENT: Normal NECK: No JVD; No carotid bruits LYMPHATICS: No lymphadenopathy CARDIAC: RRR, no murmurs, rubs, gallops RESPIRATORY:  Clear to auscultation without rales, wheezing or rhonchi  ABDOMEN: Soft, non-tender, non-distended MUSCULOSKELETAL:  No edema; No deformity  SKIN: Warm and dry NEUROLOGIC:  Alert and oriented x 3 PSYCHIATRIC:  Normal affect   ASSESSMENT:    1. PFO (patent foramen ovale)  2. Hyperlipidemia LDL goal <70   3. Primary hypertension   4. Atypical chest pain    PLAN:    In order of problems listed above:  PFO: Seen on recent coronary CT.  Plan to proceed with echocardiogram with bubble study  Hyperlipidemia: Obtain fasting lipid panel and CMP.  Continue on pravastatin  Hypertension: Blood pressure stable  Atypical chest pain: Patient had a previous cardiac catheterization in 2017 that showed mild plaque in proximal LAD, due to recent chest discomfort, coronary CT was obtained which showed no significant CAD.  I have reviewed the coronary CT result with the patient.  We will continue observation.       Medication Adjustments/Labs and Tests Ordered: Current medicines are reviewed at length with the patient today.  Concerns regarding medicines are outlined above.  Orders Placed This Encounter  Procedures   Comprehensive metabolic panel   Lipid panel   ECHOCARDIOGRAM COMPLETE BUBBLE STUDY   No orders of the defined types were placed in this encounter.   Patient Instructions  Medication Instructions:  The current medical regimen is effective;  continue present plan and medications.  *If you need a refill on your cardiac medications before your next appointment, please call your pharmacy*   Lab Work: CMET, LIPID 1-2 weeks (fasting, nothing to eat or drink, no lab appointment needed)  If you have labs (blood work) drawn today and your tests are completely normal, you will  receive your results only by: MyChart Message (if you have MyChart) OR A paper copy in the mail If you have any lab test that is abnormal or we need to change your treatment, we will call you to review the results.   Testing/Procedures: ECHO with bubble.    Follow-Up: [facility], you and your health needs are our priority.  As part of our continuing mission to provide you with exceptional heart care, we have created designated Provider Care Teams.  These Care Teams include your primary Cardiologist (physician) and Advanced Practice Providers (APPs -  Physician Assistants and Nurse Practitioners) who all work together to provide you with the care you need, when you need it.  We recommend signing up for the patient portal called "MyChart".  Sign up information is provided on this After Visit Summary.  MyChart is used to connect with patients for Virtual Visits (Telemedicine).  Patients are able to view lab/test results, encounter notes, upcoming appointments, etc.  Non-urgent messages can be sent to your provider as well.   To learn more about what you can do with MyChart, go to [email or web address].    Your next appointment:   Keep scheduled with Dr.Harding.    Ramond DialSigned, Daymien Goth, [place]PA  08/05/2021 4:54 PM    [facility]

## 2021-08-05 ENCOUNTER — Encounter: Payer: Self-pay | Admitting: Physician Assistant

## 2021-08-09 DIAGNOSIS — E785 Hyperlipidemia, unspecified: Secondary | ICD-10-CM | POA: Diagnosis not present

## 2021-08-10 LAB — COMPREHENSIVE METABOLIC PANEL
ALT: 18 IU/L (ref 0–44)
AST: 13 IU/L (ref 0–40)
Albumin/Globulin Ratio: 2 (ref 1.2–2.2)
Albumin: 4.5 g/dL (ref 3.8–4.9)
Alkaline Phosphatase: 100 IU/L (ref 44–121)
BUN/Creatinine Ratio: 21 — ABNORMAL HIGH (ref 9–20)
BUN: 19 mg/dL (ref 6–24)
Bilirubin Total: 0.4 mg/dL (ref 0.0–1.2)
CO2: 22 mmol/L (ref 20–29)
Calcium: 9.5 mg/dL (ref 8.7–10.2)
Chloride: 106 mmol/L (ref 96–106)
Creatinine, Ser: 0.89 mg/dL (ref 0.76–1.27)
Globulin, Total: 2.2 g/dL (ref 1.5–4.5)
Glucose: 132 mg/dL — ABNORMAL HIGH (ref 70–99)
Potassium: 4.1 mmol/L (ref 3.5–5.2)
Sodium: 145 mmol/L — ABNORMAL HIGH (ref 134–144)
Total Protein: 6.7 g/dL (ref 6.0–8.5)
eGFR: 104 mL/min/{1.73_m2} (ref >59)

## 2021-08-10 LAB — LIPID PANEL
Chol/HDL Ratio: 4.9 ratio (ref 0.0–5.0)
Cholesterol, Total: 171 mg/dL (ref 100–199)
HDL: 35 mg/dL — ABNORMAL LOW (ref >39)
LDL Chol Calc (NIH): 99 mg/dL (ref 0–99)
Triglycerides: 218 mg/dL — ABNORMAL HIGH (ref 0–149)
VLDL Cholesterol Cal: 37 mg/dL (ref 5–40)

## 2021-08-10 NOTE — Progress Notes (Signed)
Stable renal function. Borderline high sodium which is 1 point above normal, will continue to observe with periodic lab work, but should not cause any symptom. Triglyceride is still high. Continue Vascepa and increase pravastatin to 80mg  daily. Repeat CMP and FLP in 6 month, this can be obtained either in our office or ordered PCP on his next phyical, whichever the patient prefers

## 2021-08-23 ENCOUNTER — Telehealth: Payer: Self-pay

## 2021-08-23 MED ORDER — PRAVASTATIN SODIUM 80 MG PO TABS: 80.0000 mg | ORAL_TABLET | Freq: Every day | ORAL | 3 refills | Status: AC

## 2021-08-23 NOTE — Telephone Encounter (Signed)
Updated medication list to reflect change to Pravastatin.  ?

## 2021-08-23 NOTE — Telephone Encounter (Addendum)
Tried calling patient with results and could not leave voice message due to voice mailbox is full. I will try calling patient again. I will also send to patient via MyChart. ? ?----- Message from Almyra Deforest, [place] sent at 08/10/2021  8:33 AM EDT ----- ?Stable renal function. Borderline high sodium which is 1 point above normal, will continue to observe with periodic lab work, but should not cause any symptom. Triglyceride is still high. Continue Vascepa and increase pravastatin to 80mg  daily. Repea ?t CMP and FLP in 6 month, this can be obtained either in our office or ordered PCP on his next phyical, whichever the patient prefers   ?

## 2021-08-27 HISTORY — PX: TRANSTHORACIC ECHOCARDIOGRAM: SHX275

## 2021-08-30 ENCOUNTER — Ambulatory Visit (HOSPITAL_COMMUNITY): Payer: BC Managed Care – PPO | Attending: Cardiology

## 2021-08-30 DIAGNOSIS — Q2112 Patent foramen ovale: Secondary | ICD-10-CM | POA: Insufficient documentation

## 2021-08-30 HISTORY — DX: Patent foramen ovale: Q21.12

## 2021-08-30 LAB — ECHOCARDIOGRAM COMPLETE BUBBLE STUDY
Area-P 1/2: 2.29 cm2
S' Lateral: 3.3 cm

## 2021-09-06 ENCOUNTER — Other Ambulatory Visit: Payer: Self-pay

## 2021-09-06 DIAGNOSIS — E785 Hyperlipidemia, unspecified: Secondary | ICD-10-CM

## 2021-09-12 ENCOUNTER — Ambulatory Visit: Payer: Self-pay | Admitting: Cardiology

## 2021-10-17 ENCOUNTER — Ambulatory Visit (INDEPENDENT_AMBULATORY_CARE_PROVIDER_SITE_OTHER): Payer: BC Managed Care – PPO | Admitting: Cardiology

## 2021-10-17 ENCOUNTER — Encounter: Payer: Self-pay | Admitting: Cardiology

## 2021-10-17 VITALS — BP 142/98 | HR 84 | Ht 73.0 in | Wt 202.4 lb

## 2021-10-17 DIAGNOSIS — I2510 Atherosclerotic heart disease of native coronary artery without angina pectoris: Secondary | ICD-10-CM | POA: Diagnosis not present

## 2021-10-17 DIAGNOSIS — E785 Hyperlipidemia, unspecified: Secondary | ICD-10-CM | POA: Diagnosis not present

## 2021-10-17 DIAGNOSIS — Q2112 Patent foramen ovale: Secondary | ICD-10-CM

## 2021-10-17 DIAGNOSIS — R079 Chest pain, unspecified: Secondary | ICD-10-CM

## 2021-10-17 DIAGNOSIS — I10 Essential (primary) hypertension: Secondary | ICD-10-CM | POA: Diagnosis not present

## 2021-10-17 NOTE — Progress Notes (Signed)
Primary Care Provider: Maren Reamer, MD (Inactive) Cardiologist: Glenetta Hew, MD Electrophysiologist: None  Clinic Note: Chief Complaint  Patient presents with   Follow-up    Test results-echocardiogram bubble study.-Noted PFO  Stable from a cardiac standpoint   ===================================  ASSESSMENT/PLAN   Problem List Items Addressed This Visit       Cardiology Problems   Coronary artery disease, non-occlusive (Chronic)    Very minimal CAD. He is now on ACE inhibitor pravastatin.   He is also taking 81 mg aspirin.-This is as much protective with PFO as it is protective for CAD which he has minimal disease.      Dyslipidemia, goal LDL below 100 (Chronic)    Labs and plan to be rechecked in roughly September timeframe.  Seems to be tolerating high-dose of pravastatin along with the Vascepa.        PFO (patent foramen ovale) - Primary (Chronic)   Essential hypertension (Chronic)    Blood pressure little high today on lisinopril-there is room to titrate up further but she seems to be feeling less unsteady on her gait. For now, we will hold off further titration of medications.  She he says his pressure is usually much better than they are here today when he is at home.        Other   Chest pain of uncertain etiology    Has been quite a long time since he has had any chest pain.  Coronary CT angiogram was very reassuring.  .       ===================================  HPI:    Walter Griffin is a 51 y.o. male with a PMH notable for HTN, HLD, minimal-nonobstructive CAD, with reactive airway disease who presents today for 75-month follow-up to discuss results of echocardiogram with bubble study..  Cardiac Cath April 2017: Ostial LAD 95% melena significant CAD.  (False activation of STEMI)  I last saw Walter Griffin in March 2021 for follow-up of blood pressure and lipids.  He noted dyspnea with modest exertion such as rushing uphill or going  upstairs.  Otherwise no major cardiac symptoms.  BP had gone back up again so he was restarted on BP meds including HCTZ.  He did indicate having stopped pravastatin.  No chest pain.  He was dealing with some congestion and coughing wheezing from seasonal allergies. => Plan was to continue statin but no beta-blocker due to low heart rate.  We did restart his HCTZ because of some mild swelling.  He was seen by Nicholes Rough, PA on January 25 for ER follow-up after presenting with chest pain --> she had a low-dose lisinopril and ordered coronary CTA.) -> Cor CTA revealed Coronary Calcium Score 0 but did suggest PFO with left-to-right shunt but no significant CAD. -> Pravastatin increased to 40 mg because of elevated triglycerides.  He was was most recently seen on August 03, 2021 by Almyra Deforest, PA-C as a 61-month follow-up after seeing Nicholes Rough.  Denies any recurrent denied any recent chest pain or dyspnea.   Echo has been ordered along with bubble study to better evaluate PFO. He was stable from cardiac standpoint.  Labs ordered. Follow-up labs showed borderline high sodium level stable.  Triglycerides still high.  Increase pravastatin to 80 mg and continue to Vascepa up.  Plan was recheck in 6 months.  Recent Hospitalizations: None  Reviewed  CV studies:    The following studies were reviewed today: (if available, images/films reviewed: From Epic Chart or Care Everywhere) Coronary  CTA 07/22/2021: Calcium score of 0.  Normal coronary origin with normal right dominance.  Small PFO with left-to-right flow suspected.  Left> right gynecomastia.  2D Echo with bubble for 08/30/2021 : LVEF 55 to 60%.  No RWMA.  GR 1 DD.  Normal RV size and function.  Normal aortic and mitral valves.  Normal RAP.  Agitated saline contrast/bubble study was positive with bubbles/shunting observed within 3-6 cardiac cycles, suggesting intra atrial shunt.  No suggestion of atrial septal aneurysm..  Interval History:   Walter Griffin presents here today for follow-up to discuss results was normal study.  He has not had any signs or symptoms of headaches or migraines.  No syncope or near syncope.  No TIA or amaurosis fugax symptoms. His blood pressure is high for him today.  He says usually is in the 130/80 range.  Blood pressure is not so been a issue for him but he has occasional times when his heart rate goes up intermittently not necessarily associated with any dyspnea or chest pain.  He denies any chest pain or pressure at rest exertion.  No PND, orthopnea or edema.  Other than some tachycardia, no signs/symptoms of arrhythmia. No claudication.  REVIEWED OF SYSTEMS   Review of Systems  Constitutional:  Positive for malaise/fatigue (A little bit.). Negative for weight loss.  HENT:  Negative for congestion and nosebleeds.   Respiratory:  Positive for cough. Negative for shortness of breath and wheezing.   Cardiovascular:  Negative for claudication.  Gastrointestinal:  Negative for blood in stool and melena.  Genitourinary:  Negative for hematuria.  Musculoskeletal:  Positive for joint pain (Feet). Negative for myalgias.  Neurological:  Negative for dizziness and focal weakness.  Psychiatric/Behavioral: Negative.     I have reviewed and (if needed) personally updated the patient's problem list, medications, allergies, past medical and surgical history, social and family history.   PAST MEDICAL HISTORY   Past Medical History:  Diagnosis Date   GERD (gastroesophageal reflux disease)    Symptoms are concerning for possible angina. Was taken to Cath Lab. Treated with Protonix after hisCADnoted.   H/O seasonal allergies    PFO (patent foramen ovale) 08/30/2021   Echo with Bubble Study positive for shunt within 3-6 cycles suggesting intra-atrial shunt.   Sinus drainage     PAST SURGICAL HISTORY   Past Surgical History:  Procedure Laterality Date   CARDIAC CATHETERIZATION N/A 09/02/2015   Procedure: Left  Heart Cath and Coronary Angiography;  Surgeon: Leonie Man, MD;  Location: [facility] CV [facility];  Service: Cardiovascular;  ostial LAD 35% stenosis at a bend point. Normal EF. Normal EDP.   CT CTA CORONARY W/CA SCORE W/CM &/OR WO/CM  07/22/2021   Calcium score of 0.  Normal coronary origin with normal right dominance.  Small PFO with left-to-right flow suspected.  Left> right gynecomastia.   TRANSTHORACIC ECHOCARDIOGRAM  08/2021   -Bubble Study: LVEF 55 to 60%.  No RWMA.  GR 1 DD.  Normal RV size and function.  Normal aortic and mitral valves.  Normal RAP.  Agitated saline contrast/bubble study was positive with bubbles/shunting observed within 3-6 cardiac cycles, suggesting intra atrial shunt.  No suggestion of atrial septal aneurysm..    Immunization History  Administered Date(s) Administered   Influenza,inj,Quad PF,6+ Mos 06/20/2016   Pneumococcal Polysaccharide-23 09/03/2015, 06/20/2016    MEDICATIONS/ALLERGIES   Current Meds  Medication Sig   aspirin EC 81 MG tablet Take 1 tablet (81 mg total) by mouth daily.  Swallow whole.   escitalopram (LEXAPRO) 10 MG tablet Take 10 mg by mouth daily in the afternoon.   fluticasone (FLONASE) 50 MCG/ACT nasal spray Place 1 spray into both nostrils daily.   ibuprofen (ADVIL,MOTRIN) 200 MG tablet Take 400 mg by mouth every 6 (six) hours as needed (pain).   icosapent Ethyl (VASCEPA) 1 g capsule Take 2 capsules (2 g total) by mouth 2 (two) times daily.   lisinopril (ZESTRIL) 5 MG tablet Take 1 tablet (5 mg total) by mouth daily.   loratadine (CLARITIN) 10 MG tablet Take 10 mg by mouth daily as needed for allergies.   metoprolol tartrate (LOPRESSOR) 100 MG tablet Take 1 tablet (100 mg total) by mouth once for 1 dose. Take 1 tab 2 hours prior to procedure   nicotine polacrilex (COMMIT) 4 MG lozenge Take 4 mg by mouth.   pravastatin (PRAVACHOL) 80 MG tablet Take 1 tablet (80 mg total) by mouth daily.    No Known Allergies  SOCIAL HISTORY/FAMILY  HISTORY   Reviewed in Epic:  Pertinent findings:  Social History   Tobacco Use   Smoking status: Never   Smokeless tobacco: Current  Substance Use Topics   Alcohol use: Yes    Comment: SOCIAL   Drug use: No   Social History   Social History Narrative   Not on file    OBJCTIVE -PE, EKG, labs   Wt Readings from Last 3 Encounters:  10/17/21 202 lb 6.4 oz (91.8 kg)  08/03/21 203 lb 12.8 oz (92.4 kg)  06/22/21 204 lb 9.6 oz (92.8 kg)    Physical Exam: BP (!) 142/98   Pulse 84   Ht 6\' 1"  (1.854 m)   Wt 202 lb 6.4 oz (91.8 kg)   SpO2 98%   BMI 26.70 kg/m  Physical Exam Vitals reviewed.  Constitutional:      General: He is not in acute distress.    Appearance: Normal appearance. He is normal weight. He is not ill-appearing or toxic-appearing.  HENT:     Head: Normocephalic and atraumatic.  Neck:     Vascular: No carotid bruit or JVD.  Cardiovascular:     Rate and Rhythm: Normal rate and regular rhythm. No extrasystoles are present.    Chest Wall: PMI is not displaced.     Pulses: Normal pulses.     Heart sounds: S1 normal and S2 normal. No murmur heard.    No friction rub. No gallop.  Musculoskeletal:     Cervical back: Normal range of motion and neck supple.  Neurological:     Mental Status: He is alert.     Adult ECG Report Not checked  Recent Labs: Reviewed.  Pravastatin dose had been increased along with additional Vascepa.  3 for 6 months. Lab Results  Component Value Date   CHOL 171 08/09/2021   HDL 35 (L) 08/09/2021   LDLCALC 99 08/09/2021   TRIG 218 (H) 08/09/2021   CHOLHDL 4.9 08/09/2021   Lab Results  Component Value Date   CREATININE 0.89 08/09/2021   BUN 19 08/09/2021   NA 145 (H) 08/09/2021   K 4.1 08/09/2021   CL 106 08/09/2021   CO2 22 08/09/2021      Latest Ref Rng & Units 06/20/2016    3:43 PM 09/03/2015    4:00 AM 09/02/2015    9:21 AM  CBC  WBC 3.8 - 10.8 K/uL 9.1  11.1  13.6   Hemoglobin 13.2 - 17.1 g/dL 15.7  13.4  13.1  Hematocrit 38.5 - 50.0 % 46.2  41.9  40.4   Platelets 140 - 400 K/uL 382  310  321     Lab Results  Component Value Date   HGBA1C 5.6 06/20/2016   Lab Results  Component Value Date   TSH 0.728 09/03/2015    ==================================================  COVID-19 Education: The signs and symptoms of COVID-19 were discussed with the patient and how to seek care for testing (follow up with PCP or arrange E-visit).    I spent a total of 21 minutes with the patient spent in direct patient consultation.  Additional time spent with chart review  / charting (studies, outside notes, etc): 20 min  Total Time: 41 min  Current medicines are reviewed at length with the patient today.  (+/- concerns) N/A  This visit occurred during the SARS-CoV-2 public health emergency.  Safety protocols were in place, including screening questions prior to the visit, additional usage of staff PPE, and extensive cleaning of exam room while observing appropriate contact time as indicated for disinfecting solutions.  Notice: This dictation was prepared with Dragon dictation along with smart phrase technology. Any transcriptional errors that result from this process are unintentional and may not be corrected upon review.  Studies Ordered:   No orders of the defined types were placed in this encounter.  No orders of the defined types were placed in this encounter.   Patient Instructions / Medication Changes & Studies & Tests Ordered   Patient Instructions  Medication Instructions:  No changes   *If you need a refill on your cardiac medications before your next appointment, please call your pharmacy*   Lab Work: Not needed    Testing/Procedures:  Not needd  Follow-Up: At [facility], you and your health needs are our priority.  As part of our continuing mission to provide you with exceptional heart care, we have created designated Provider Care Teams.  These Care Teams include your  primary Cardiologist (physician) and Advanced Practice Providers (APPs -  Physician Assistants and Nurse Practitioners) who all work together to provide you with the care you need, when you need it.     Your next appointment:   12 month(s)  The format for your next appointment:   In Person  Provider:   Glenetta Hew, MD    Other Instructions   monitor your blood pressure.   Will contact you once  review your Echo with Dr Lona Millard, M.D., M.S. Interventional Cardiologist   Pager # [phone] Phone # [phone] [address] [place], [place] [address]   Thank you for choosing Heartcare at [facility]!!

## 2021-10-17 NOTE — Patient Instructions (Addendum)
Medication Instructions:  No changes   *If you need a refill on your cardiac medications before your next appointment, please call your pharmacy*   Lab Work: Not needed    Testing/Procedures:  Not needd  Follow-Up: At [facility], you and your health needs are our priority.  As part of our continuing mission to provide you with exceptional heart care, we have created designated Provider Care Teams.  These Care Teams include your primary Cardiologist (physician) and Advanced Practice Providers (APPs -  Physician Assistants and Nurse Practitioners) who all work together to provide you with the care you need, when you need it.     Your next appointment:   12 month(s)  The format for your next appointment:   In Person  Provider:   Glenetta Hew, MD    Other Instructions   monitor your blood pressure.   Will contact you once  review your Echo with Dr Burt Knack

## 2021-11-22 ENCOUNTER — Encounter: Payer: Self-pay | Admitting: Cardiology

## 2021-11-23 ENCOUNTER — Encounter: Payer: Self-pay | Admitting: Cardiology

## 2021-11-23 DIAGNOSIS — Q2112 Patent foramen ovale: Secondary | ICD-10-CM | POA: Insufficient documentation

## 2021-11-23 NOTE — Assessment & Plan Note (Signed)
Blood pressure little high today on lisinopril-there is room to titrate up further but she seems to be feeling less unsteady on her gait. For now, we will hold off further titration of medications.  She he says his pressure is usually much better than they are here today when he is at home.

## 2021-11-23 NOTE — Assessment & Plan Note (Signed)
Labs and plan to be rechecked in roughly September timeframe.  Seems to be tolerating high-dose of pravastatin along with the Vascepa.

## 2021-11-23 NOTE — Assessment & Plan Note (Signed)
Very minimal CAD. He is now on ACE inhibitor pravastatin.   He is also taking 81 mg aspirin.-This is as much protective with PFO as it is protective for CAD which he has minimal disease.

## 2021-11-23 NOTE — Assessment & Plan Note (Signed)
Has been quite a long time since he has had any chest pain.  Coronary CT angiogram was very reassuring.  Walter Griffin

## 2021-12-02 ENCOUNTER — Telehealth: Payer: Self-pay | Admitting: *Deleted

## 2021-12-02 NOTE — Telephone Encounter (Signed)
Attempted to contact patient by phone - mail box is full. Sent message by [employer].   Dr Herbie Baltimore reviewed patient care with  with Dr Excell Seltzer.   Per Dr Herbie Baltimore  can we let Walter Griffin know that we can just continue treating with aspirin and treating his other risk factors.   DH

## 2022-03-23 ENCOUNTER — Other Ambulatory Visit: Payer: Self-pay

## 2022-03-23 DIAGNOSIS — E785 Hyperlipidemia, unspecified: Secondary | ICD-10-CM

## 2022-09-04 DIAGNOSIS — Z1389 Encounter for screening for other disorder: Secondary | ICD-10-CM | POA: Diagnosis not present

## 2022-09-04 DIAGNOSIS — Z0131 Encounter for examination of blood pressure with abnormal findings: Secondary | ICD-10-CM | POA: Diagnosis not present

## 2022-09-04 DIAGNOSIS — K4090 Unilateral inguinal hernia, without obstruction or gangrene, not specified as recurrent: Secondary | ICD-10-CM | POA: Diagnosis not present

## 2022-09-04 DIAGNOSIS — Z1331 Encounter for screening for depression: Secondary | ICD-10-CM | POA: Diagnosis not present

## 2022-09-04 DIAGNOSIS — Z0130 Encounter for examination of blood pressure without abnormal findings: Secondary | ICD-10-CM | POA: Diagnosis not present

## 2022-09-25 ENCOUNTER — Ambulatory Visit: Payer: Self-pay | Admitting: Surgery

## 2022-09-25 DIAGNOSIS — K4090 Unilateral inguinal hernia, without obstruction or gangrene, not specified as recurrent: Secondary | ICD-10-CM | POA: Diagnosis not present

## 2022-10-13 ENCOUNTER — Other Ambulatory Visit: Payer: Self-pay

## 2022-10-13 ENCOUNTER — Encounter (HOSPITAL_BASED_OUTPATIENT_CLINIC_OR_DEPARTMENT_OTHER): Payer: Self-pay | Admitting: Surgery

## 2022-10-13 ENCOUNTER — Encounter (HOSPITAL_BASED_OUTPATIENT_CLINIC_OR_DEPARTMENT_OTHER)
Admission: RE | Admit: 2022-10-13 | Discharge: 2022-10-13 | Disposition: A | Discharge status: Home / Self Care | Payer: BC Managed Care – PPO | Source: Ambulatory Visit | Attending: Surgery | Admitting: Surgery

## 2022-10-13 DIAGNOSIS — Z01818 Encounter for other preprocedural examination: Secondary | ICD-10-CM | POA: Diagnosis not present

## 2022-10-13 NOTE — Progress Notes (Signed)

## 2022-10-16 ENCOUNTER — Telehealth: Payer: Self-pay | Admitting: *Deleted

## 2022-10-16 NOTE — Telephone Encounter (Signed)
I have attempted to contact this patient by phone but voicemail was full. I will continue to try later.

## 2022-10-16 NOTE — Telephone Encounter (Signed)
Will send a MyChart message to patient.

## 2022-10-16 NOTE — Telephone Encounter (Signed)
   Name: Walter Griffin  DOB: 1971/04/25  MRN: [id number]  Primary Cardiologist: Bryan Lemma, MD   Preoperative team, please contact this patient and set up a phone call appointment for further preoperative risk assessment. Please obtain consent and complete medication review. Thank you for your help.  I confirm that guidance regarding antiplatelet and oral anticoagulation therapy has been completed and, if necessary, noted below.  Per office protocol, if patient is without any new symptoms or concerns at the time of their virtual visit, he may hold Aspirin for 5-7 days prior to procedure. Please resume Aspirin as soon as possible postprocedure, at the discretion of the surgeon.    Joylene Grapes, NP 10/16/2022, 1:26 PM [facility]

## 2022-10-16 NOTE — Telephone Encounter (Signed)
   Pre-operative Risk Assessment    Patient Name: Walter Griffin  DOB: Apr 05, 1971 MRN: [id number]      Request for Surgical Clearance    Procedure:   Hernia Surgery  Date of Surgery:  Clearance 10/19/22                                 Surgeon:  Dr. Harriette Bouillon Surgeon's Group or Practice Name:  [facility] Phone number:  [phone] Fax number:  [phone]   Type of Clearance Requested:   - Medical  - Pharmacy:  Hold Aspirin Not Indicated   Type of Anesthesia:  General    Additional requests/questions:    Signed, Emmit Pomfret   10/16/2022, 1:18 PM

## 2022-10-16 NOTE — Telephone Encounter (Signed)
Called patient and could not leave a voice message on patient's number listed. Called the requesting office and left a voice message informing them that we are trying to make contact with patient for a telephone visit for upcoming 10/19/22 procedure.

## 2022-10-18 ENCOUNTER — Telehealth: Payer: Self-pay

## 2022-10-18 ENCOUNTER — Ambulatory Visit: Payer: BC Managed Care – PPO | Attending: Nurse Practitioner | Admitting: Nurse Practitioner

## 2022-10-18 DIAGNOSIS — Z01810 Encounter for preprocedural cardiovascular examination: Secondary | ICD-10-CM | POA: Diagnosis not present

## 2022-10-18 NOTE — Progress Notes (Signed)
Called completed with patient on 10/13/2022. Patient did come in for bp check and preop EKG at the surgery center. BP recorded at 156/98. He states that he is not taking any bp or cholesterol meds currently and sometimes takes asa. Per cards not 09/2021, patient is to be taking bp meds and would need a follow up in a year. I spoke with Steward Drone  at Dr. Guillermina [place] office, to make sure that cards will give ok for surgery due to not taking meds recently.  Per recent cardiology notes patient will need to see cards for clearance which hasn't been done as of yet. New message left for Steward Drone today for follow up or case will need to be r/s.Confirmed with Dr. Bradley Ferris that patient will need to be cleared by cards before moving forward for planned surgery 10/19/2022.

## 2022-10-18 NOTE — Telephone Encounter (Signed)
Pt was scheduled 10/18/22 at 3:40 -pm med rec and consent done.     Patient Consent for Virtual Visit        Walter Griffin has provided verbal consent on 10/18/2022 for a virtual visit (video or telephone).   CONSENT FOR VIRTUAL VISIT FOR:  Walter Griffin  By participating in this virtual visit I agree to the following:  I hereby voluntarily request, consent and authorize [facility] and its employed or contracted physicians, physician assistants, nurse practitioners or other licensed health care professionals (the Practitioner), to provide me with telemedicine health care services (the "Services") as deemed necessary by the treating Practitioner. I acknowledge and consent to receive the Services by the Practitioner via telemedicine. I understand that the telemedicine visit will involve communicating with the Practitioner through live audiovisual communication technology and the disclosure of certain medical information by electronic transmission. I acknowledge that I have been given the opportunity to request an in-person assessment or other available alternative prior to the telemedicine visit and am voluntarily participating in the telemedicine visit.  I understand that I have the right to withhold or withdraw my consent to the use of telemedicine in the course of my care at any time, without affecting my right to future care or treatment, and that the Practitioner or I may terminate the telemedicine visit at any time. I understand that I have the right to inspect all information obtained and/or recorded in the course of the telemedicine visit and may receive copies of available information for a reasonable fee.  I understand that some of the potential risks of receiving the Services via telemedicine include:  Delay or interruption in medical evaluation due to technological equipment failure or disruption; Information transmitted may not be sufficient (e.g. poor resolution of  images) to allow for appropriate medical decision making by the Practitioner; and/or  In rare instances, security protocols could fail, causing a breach of personal health information.  Furthermore, I acknowledge that it is my responsibility to provide information about my medical history, conditions and care that is complete and accurate to the best of my ability. I acknowledge that Practitioner's advice, recommendations, and/or decision may be based on factors not within their control, such as incomplete or inaccurate data provided by me or distortions of diagnostic images or specimens that may result from electronic transmissions. I understand that the practice of medicine is not an exact science and that Practitioner makes no warranties or guarantees regarding treatment outcomes. I acknowledge that a copy of this consent can be made available to me via my patient portal [facility] MyChart), or I can request a printed copy by calling the office of [facility].    I understand that my insurance will be billed for this visit.   I have read or had this consent read to me. I understand the contents of this consent, which adequately explains the benefits and risks of the Services being provided via telemedicine.  I have been provided ample opportunity to ask questions regarding this consent and the Services and have had my questions answered to my satisfaction. I give my informed consent for the services to be provided through the use of telemedicine in my medical care

## 2022-10-18 NOTE — Addendum Note (Signed)
Addended by: Anselm Lis A on: 10/18/2022 01:54 PM   Modules accepted: Orders

## 2022-10-18 NOTE — Progress Notes (Signed)
Virtual Visit via Telephone Note   Because of Walter Griffin's co-morbid illnesses, he is at least at moderate risk for complications without adequate follow up.  This format is felt to be most appropriate for this patient at this time.  The patient did not have access to video technology/had technical difficulties with video requiring transitioning to audio format only (telephone).  All issues noted in this document were discussed and addressed.  No physical exam could be performed with this format.  Please refer to the patient's chart for his consent to telehealth for [facility].  Evaluation Performed:  Preoperative cardiovascular risk assessment _____________   Date:  10/18/2022   Patient ID:  Walter Griffin, DOB 10/16/1970, MRN [id number] Patient Location:  Home Provider location:   Office  Primary Care Provider:  Pete Glatter, MD (Inactive) Primary Cardiologist:  Bryan Lemma, MD  Chief Complaint / Patient Profile   52 y.o. y/o male with a h/o nonobstructive CAD, PFO, hypertension, and hyperlipidemia who is pending hernia surgery on 10/19/2022 with Dr. Harriette Bouillon of [facility] and presents today for telephonic preoperative cardiovascular risk assessment.  History of Present Illness    Walter Griffin is a 52 y.o. male who presents via audio/video conferencing for a telehealth visit today.  Pt was last seen in cardiology clinic on 10/17/2021 by Dr. Herbie Baltimore.  At that time KIERCE CEFALO was doing well.  The patient is now pending procedure as outlined above. Since his last visit, he has done well from a cardiac standpoint.   He denies chest pain, palpitations, dyspnea, pnd, orthopnea, n, v, dizziness, syncope, edema, weight gain, or early satiety. All other systems reviewed and are otherwise negative except as noted above.   Past Medical History    Past Medical History:  Diagnosis Date   GERD (gastroesophageal reflux disease)    Symptoms  are concerning for possible angina. Was taken to Cath Lab. Treated with Protonix after hisCADnoted.   H/O seasonal allergies    PFO (patent foramen ovale) 08/30/2021   Echo with Bubble Study positive for shunt within 3-6 cycles suggesting intra-atrial shunt.   Sinus drainage    Past Surgical History:  Procedure Laterality Date   CARDIAC CATHETERIZATION N/A 09/02/2015   Procedure: Left Heart Cath and Coronary Angiography;  Surgeon: Marykay Lex, MD;  Location: [facility] INVASIVE CV LAB;  Service: Cardiovascular;  ostial LAD 35% stenosis at a bend point. Normal EF. Normal EDP.   CT CTA CORONARY W/CA SCORE W/CM &/OR WO/CM  07/22/2021   Calcium score of 0.  Normal coronary origin with normal right dominance.  Small PFO with left-to-right flow suspected.  Left> right gynecomastia.   TRANSTHORACIC ECHOCARDIOGRAM  08/2021   -Bubble Study: LVEF 55 to 60%.  No RWMA.  GR 1 DD.  Normal RV size and function.  Normal aortic and mitral valves.  Normal RAP.  Agitated saline contrast/bubble study was positive with bubbles/shunting observed within 3-6 cardiac cycles, suggesting intra atrial shunt.  No suggestion of atrial septal aneurysm..    Allergies  No Known Allergies  Home Medications    Prior to Admission medications   Medication Sig Start Date End Date Taking? Authorizing Provider  aspirin EC 81 MG tablet Take 1 tablet (81 mg total) by mouth daily. Swallow whole. Patient not taking: Reported on 10/18/2022 07/28/21   Sharlene Dory, PA-C  fluticasone [facility]) 50 MCG/ACT nasal spray Place 1 spray into both nostrils daily. 07/18/21   [provider]  ibuprofen (ADVIL,MOTRIN) 200 MG tablet Take 400 mg by mouth every 6 (six) hours as needed (pain). Patient not taking: Reported on 10/18/2022    [provider]  icosapent Ethyl (VASCEPA) 1 g capsule Take 2 capsules (2 g total) by mouth 2 (two) times daily. Patient not taking: Reported on 10/18/2022 07/04/21   Sharlene Dory, PA-C  loratadine  (CLARITIN) 10 MG tablet Take 10 mg by mouth daily as needed for allergies.    [provider]  nicotine polacrilex (COMMIT) 4 MG lozenge Take 4 mg by mouth. Patient not taking: Reported on 10/18/2022 07/19/21   [provider]  pravastatin (PRAVACHOL) 80 MG tablet Take 1 tablet (80 mg total) by mouth daily. Patient not taking: Reported on 10/18/2022 08/23/21   Azalee Course, [place]    Physical Exam    Vital Signs:  DAONTE GRIBBEN does not have vital signs available for review today.  Given telephonic nature of communication, physical exam is limited. AAOx3. NAD. Normal affect.  Speech and respirations are unlabored.  Accessory Clinical Findings    None  Assessment & Plan    1.  Preoperative Cardiovascular Risk Assessment:  According to the Revised Cardiac Risk Index (RCRI), his Perioperative Risk of Major Cardiac Event is (%): 0.4. His Functional Capacity in METs is: 8.97 according to the Duke Activity Status Index (DASI). Therefore, based on ACC/AHA guidelines, patient would be at acceptable risk for the planned procedure without further cardiovascular testing.  The patient was advised that if he develops new symptoms prior to surgery to contact our office to arrange for a follow-up visit, and he verbalized understanding.  Per office protocol, he may hold Aspirin for 5-7 days prior to procedure. Please resume Aspirin as soon as possible postprocedure, at the discretion of the surgeon.    A copy of this note will be routed to requesting surgeon.  Time:   Today, I have spent 5 minutes with the patient with telehealth technology discussing medical history, symptoms, and management plan.     Joylene Grapes, NP  10/18/2022, 3:52 PM

## 2022-10-18 NOTE — Telephone Encounter (Signed)
Pt was scheduled 10/18/22 at 3:40 -pm med rec and consent done.

## 2022-10-19 ENCOUNTER — Ambulatory Visit (HOSPITAL_BASED_OUTPATIENT_CLINIC_OR_DEPARTMENT_OTHER)
Admission: RE | Admit: 2022-10-19 | Discharge: 2022-10-19 | Disposition: A | Discharge status: Home / Self Care | Payer: BC Managed Care – PPO | Attending: Surgery | Admitting: Surgery

## 2022-10-19 ENCOUNTER — Ambulatory Visit (HOSPITAL_BASED_OUTPATIENT_CLINIC_OR_DEPARTMENT_OTHER): Payer: BC Managed Care – PPO | Admitting: Anesthesiology

## 2022-10-19 ENCOUNTER — Encounter (HOSPITAL_BASED_OUTPATIENT_CLINIC_OR_DEPARTMENT_OTHER): Payer: Self-pay | Admitting: Surgery

## 2022-10-19 ENCOUNTER — Other Ambulatory Visit: Payer: Self-pay

## 2022-10-19 ENCOUNTER — Encounter (HOSPITAL_BASED_OUTPATIENT_CLINIC_OR_DEPARTMENT_OTHER)
Admission: RE | Disposition: A | Discharge status: Home / Self Care | Payer: Self-pay | Source: Home / Self Care | Attending: Surgery

## 2022-10-19 DIAGNOSIS — I2510 Atherosclerotic heart disease of native coronary artery without angina pectoris: Secondary | ICD-10-CM | POA: Diagnosis not present

## 2022-10-19 DIAGNOSIS — I10 Essential (primary) hypertension: Secondary | ICD-10-CM | POA: Diagnosis not present

## 2022-10-19 DIAGNOSIS — K4090 Unilateral inguinal hernia, without obstruction or gangrene, not specified as recurrent: Secondary | ICD-10-CM | POA: Insufficient documentation

## 2022-10-19 DIAGNOSIS — G8918 Other acute postprocedural pain: Secondary | ICD-10-CM | POA: Diagnosis not present

## 2022-10-19 DIAGNOSIS — K219 Gastro-esophageal reflux disease without esophagitis: Secondary | ICD-10-CM | POA: Insufficient documentation

## 2022-10-19 DIAGNOSIS — Z01818 Encounter for other preprocedural examination: Secondary | ICD-10-CM

## 2022-10-19 DIAGNOSIS — R0602 Shortness of breath: Secondary | ICD-10-CM | POA: Diagnosis not present

## 2022-10-19 HISTORY — PX: INGUINAL HERNIA REPAIR: SHX194

## 2022-10-19 HISTORY — PX: INSERTION OF MESH: SHX5868

## 2022-10-19 SURGERY — REPAIR, HERNIA, INGUINAL, ADULT
Anesthesia: Regional | Site: Groin | Laterality: Right

## 2022-10-19 MED ORDER — OXYCODONE HCL 5 MG PO TABS
ORAL_TABLET | ORAL | Status: AC
  Filled 2022-10-19: qty 1

## 2022-10-19 MED ORDER — ACETAMINOPHEN 10 MG/ML IV SOLN
INTRAVENOUS | Status: DC | PRN
  Administered 2022-10-19: 1000 mg via INTRAVENOUS

## 2022-10-19 MED ORDER — PROPOFOL 10 MG/ML IV BOLUS
INTRAVENOUS | Status: AC
  Filled 2022-10-19: qty 20

## 2022-10-19 MED ORDER — BUPIVACAINE-EPINEPHRINE (PF) 0.25% -1:200000 IJ SOLN
INTRAMUSCULAR | Status: AC
  Filled 2022-10-19: qty 30

## 2022-10-19 MED ORDER — MIDAZOLAM HCL 2 MG/2ML IJ SOLN
INTRAMUSCULAR | Status: AC
  Filled 2022-10-19: qty 2

## 2022-10-19 MED ORDER — ROCURONIUM BROMIDE 10 MG/ML (PF) SYRINGE
PREFILLED_SYRINGE | INTRAVENOUS | Status: AC
  Filled 2022-10-19: qty 10

## 2022-10-19 MED ORDER — ACETAMINOPHEN 10 MG/ML IV SOLN: 1000.0000 mg | Freq: Once | INTRAVENOUS | Status: DC | PRN

## 2022-10-19 MED ORDER — BUPIVACAINE-EPINEPHRINE 0.25% -1:200000 IJ SOLN
INTRAMUSCULAR | Status: DC | PRN
  Administered 2022-10-19: 10 mL

## 2022-10-19 MED ORDER — CHLORHEXIDINE GLUCONATE CLOTH 2 % EX PADS: 6.0000 | MEDICATED_PAD | Freq: Once | CUTANEOUS | Status: DC

## 2022-10-19 MED ORDER — OXYCODONE HCL 5 MG/5ML PO SOLN: 5.0000 mg | Freq: Once | ORAL | Status: AC | PRN

## 2022-10-19 MED ORDER — CEFAZOLIN SODIUM-DEXTROSE 2-4 GM/100ML-% IV SOLN
2.0000 g | INTRAVENOUS | Status: AC
  Administered 2022-10-19: 2 g via INTRAVENOUS

## 2022-10-19 MED ORDER — FENTANYL CITRATE (PF) 100 MCG/2ML IJ SOLN
INTRAMUSCULAR | Status: DC | PRN
  Administered 2022-10-19: 100 ug via INTRAVENOUS

## 2022-10-19 MED ORDER — OXYCODONE HCL 5 MG PO TABS
5.0000 mg | ORAL_TABLET | Freq: Once | ORAL | Status: AC | PRN
  Administered 2022-10-19: 5 mg via ORAL

## 2022-10-19 MED ORDER — PROPOFOL 10 MG/ML IV BOLUS
INTRAVENOUS | Status: DC | PRN
  Administered 2022-10-19: 150 mg via INTRAVENOUS

## 2022-10-19 MED ORDER — BUPIVACAINE-EPINEPHRINE (PF) 0.5% -1:200000 IJ SOLN
INTRAMUSCULAR | Status: DC | PRN
  Administered 2022-10-19: 25 mL via PERINEURAL

## 2022-10-19 MED ORDER — 0.9 % SODIUM CHLORIDE (POUR BTL) OPTIME
TOPICAL | Status: DC | PRN
  Administered 2022-10-19: 1000 mL

## 2022-10-19 MED ORDER — FENTANYL CITRATE (PF) 100 MCG/2ML IJ SOLN
INTRAMUSCULAR | Status: AC
  Filled 2022-10-19: qty 2

## 2022-10-19 MED ORDER — SUGAMMADEX SODIUM 200 MG/2ML IV SOLN
INTRAVENOUS | Status: DC | PRN
  Administered 2022-10-19: 200 mg via INTRAVENOUS

## 2022-10-19 MED ORDER — LIDOCAINE-EPINEPHRINE (PF) 1.5 %-1:200000 IJ SOLN
INTRAMUSCULAR | Status: DC | PRN
  Administered 2022-10-19: 5 mL via PERINEURAL

## 2022-10-19 MED ORDER — ACETAMINOPHEN 10 MG/ML IV SOLN
INTRAVENOUS | Status: AC
  Filled 2022-10-19: qty 100

## 2022-10-19 MED ORDER — METHOCARBAMOL 750 MG PO TABS: 750.0000 mg | ORAL_TABLET | Freq: Three times a day (TID) | ORAL | 0 refills | Status: AC | PRN

## 2022-10-19 MED ORDER — PHENYLEPHRINE HCL (PRESSORS) 10 MG/ML IV SOLN
INTRAVENOUS | Status: DC | PRN
  Administered 2022-10-19: 240 ug via INTRAVENOUS
  Administered 2022-10-19 (×2): 160 ug via INTRAVENOUS

## 2022-10-19 MED ORDER — LIDOCAINE HCL (CARDIAC) PF 100 MG/5ML IV SOSY
PREFILLED_SYRINGE | INTRAVENOUS | Status: DC | PRN
  Administered 2022-10-19: 60 mg via INTRAVENOUS

## 2022-10-19 MED ORDER — ACETAMINOPHEN 160 MG/5ML PO SOLN: 1000.0000 mg | Freq: Once | ORAL | Status: DC | PRN

## 2022-10-19 MED ORDER — ONDANSETRON HCL 4 MG/2ML IJ SOLN
INTRAMUSCULAR | Status: AC
  Filled 2022-10-19: qty 2

## 2022-10-19 MED ORDER — CEFAZOLIN SODIUM-DEXTROSE 2-4 GM/100ML-% IV SOLN
INTRAVENOUS | Status: AC
  Filled 2022-10-19: qty 100

## 2022-10-19 MED ORDER — LIDOCAINE 2% (20 MG/ML) 5 ML SYRINGE
INTRAMUSCULAR | Status: AC
  Filled 2022-10-19: qty 5

## 2022-10-19 MED ORDER — ROCURONIUM BROMIDE 100 MG/10ML IV SOLN
INTRAVENOUS | Status: DC | PRN
  Administered 2022-10-19: 70 mg via INTRAVENOUS

## 2022-10-19 MED ORDER — FENTANYL CITRATE (PF) 100 MCG/2ML IJ SOLN
25.0000 ug | INTRAMUSCULAR | Status: DC | PRN
  Administered 2022-10-19: 50 ug via INTRAVENOUS

## 2022-10-19 MED ORDER — OXYCODONE HCL 5 MG PO TABS: 5.0000 mg | ORAL_TABLET | Freq: Four times a day (QID) | ORAL | 0 refills | Status: AC | PRN

## 2022-10-19 MED ORDER — LACTATED RINGERS IV SOLN: INTRAVENOUS | Status: DC

## 2022-10-19 MED ORDER — DEXAMETHASONE SODIUM PHOSPHATE 10 MG/ML IJ SOLN
INTRAMUSCULAR | Status: AC
  Filled 2022-10-19: qty 1

## 2022-10-19 MED ORDER — IBUPROFEN 800 MG PO TABS
800.0000 mg | ORAL_TABLET | Freq: Three times a day (TID) | ORAL | 0 refills | Status: AC | PRN
Start: 2022-10-19 — End: ?

## 2022-10-19 MED ORDER — DEXAMETHASONE SODIUM PHOSPHATE 4 MG/ML IJ SOLN
INTRAMUSCULAR | Status: DC | PRN
  Administered 2022-10-19: 5 mg via INTRAVENOUS

## 2022-10-19 MED ORDER — MIDAZOLAM HCL 5 MG/5ML IJ SOLN
INTRAMUSCULAR | Status: DC | PRN
  Administered 2022-10-19: 2 mg via INTRAVENOUS

## 2022-10-19 MED ORDER — ACETAMINOPHEN 500 MG PO TABS: 1000.0000 mg | ORAL_TABLET | Freq: Once | ORAL | Status: DC | PRN

## 2022-10-19 SURGICAL SUPPLY — 48 items
ADH SKN CLS APL DERMABOND .7 (GAUZE/BANDAGES/DRESSINGS) ×1
APL PRP STRL LF DISP 70% ISPRP (MISCELLANEOUS) ×1
BLADE CLIPPER SURG (BLADE) IMPLANT
BLADE SURG 15 STRL LF DISP TIS (BLADE) ×1 IMPLANT
BLADE SURG 15 STRL SS (BLADE) ×1
CANISTER SUCT 1200ML W/VALVE (MISCELLANEOUS) IMPLANT
CHLORAPREP W/TINT 26 (MISCELLANEOUS) ×1 IMPLANT
COVER BACK TABLE 60X90IN (DRAPES) ×1 IMPLANT
COVER MAYO STAND STRL (DRAPES) ×1 IMPLANT
DERMABOND ADVANCED .7 DNX12 (GAUZE/BANDAGES/DRESSINGS) ×1 IMPLANT
DRAIN PENROSE .5X12 LATEX STL (DRAIN) ×1 IMPLANT
DRAPE LAPAROTOMY TRNSV 102X78 (DRAPES) ×1 IMPLANT
DRAPE UTILITY XL STRL (DRAPES) ×1 IMPLANT
ELECT COATED BLADE 2.86 ST (ELECTRODE) ×1 IMPLANT
ELECT REM PT RETURN 9FT ADLT (ELECTROSURGICAL) ×1
ELECTRODE REM PT RTRN 9FT ADLT (ELECTROSURGICAL) ×1 IMPLANT
GAUZE 4X4 16PLY ~~LOC~~+RFID DBL (SPONGE) IMPLANT
GAUZE SPONGE 4X4 12PLY STRL LF (GAUZE/BANDAGES/DRESSINGS) IMPLANT
GLOVE BIOGEL PI IND STRL 8 (GLOVE) ×1 IMPLANT
GLOVE ECLIPSE 8.0 STRL XLNG CF (GLOVE) ×1 IMPLANT
GOWN STRL REUS W/ TWL LRG LVL3 (GOWN DISPOSABLE) ×2 IMPLANT
GOWN STRL REUS W/ TWL XL LVL3 (GOWN DISPOSABLE) ×1 IMPLANT
GOWN STRL REUS W/TWL LRG LVL3 (GOWN DISPOSABLE) ×1
GOWN STRL REUS W/TWL XL LVL3 (GOWN DISPOSABLE) ×1
MESH HERNIA SYS ULTRAPRO LRG (Mesh General) IMPLANT
NDL HYPO 25X1 1.5 SAFETY (NEEDLE) ×1 IMPLANT
NEEDLE HYPO 25X1 1.5 SAFETY (NEEDLE) ×1 IMPLANT
NS IRRIG 1000ML POUR BTL (IV SOLUTION) IMPLANT
PACK BASIN DAY SURGERY FS (CUSTOM PROCEDURE TRAY) ×1 IMPLANT
PENCIL SMOKE EVACUATOR (MISCELLANEOUS) ×1 IMPLANT
SLEEVE SCD COMPRESS KNEE MED (STOCKING) ×1 IMPLANT
SPIKE FLUID TRANSFER (MISCELLANEOUS) IMPLANT
SPONGE T-LAP 4X18 ~~LOC~~+RFID (SPONGE) ×1 IMPLANT
STRIP CLOSURE SKIN 1/2X4 (GAUZE/BANDAGES/DRESSINGS) IMPLANT
SUT MON AB 4-0 PC3 18 (SUTURE) ×1 IMPLANT
SUT NOVA 0 T19/GS 22DT (SUTURE) IMPLANT
SUT NOVA NAB DX-16 0-1 5-0 T12 (SUTURE) ×2 IMPLANT
SUT VIC AB 2-0 SH 27 (SUTURE) ×1
SUT VIC AB 2-0 SH 27XBRD (SUTURE) ×1 IMPLANT
SUT VIC AB 3-0 54X BRD REEL (SUTURE) IMPLANT
SUT VIC AB 3-0 BRD 54 (SUTURE)
SUT VICRYL 3-0 CR8 SH (SUTURE) ×1 IMPLANT
SUT VICRYL AB 2 0 TIE (SUTURE) IMPLANT
SUT VICRYL AB 2 0 TIES (SUTURE)
SYR CONTROL 10ML LL (SYRINGE) ×1 IMPLANT
TOWEL GREEN STERILE FF (TOWEL DISPOSABLE) ×1 IMPLANT
TUBE CONNECTING 20X1/4 (TUBING) IMPLANT
YANKAUER SUCT BULB TIP NO VENT (SUCTIONS) IMPLANT

## 2022-10-19 NOTE — H&P (Signed)
Chief Complaint: New Consultation (Inguinal hernia)  History of Present Illness: Walter Griffin is a 52 y.o. male who is seen today as an office consultation for evaluation of New Consultation (Inguinal hernia)  Patient presents for evaluation of symptomatic right inguinal hernia. He has had it for a number of months. Location is right groin. There is swelling. The swelling is getting larger. He is having more discomfort as well. No nausea or vomiting. No change in bowel or bladder function.   Review of Systems: A complete review of systems was obtained from the patient. I have reviewed this information and discussed as appropriate with the patient. See HPI as well for other ROS.    Medical History: Past Medical History:  Diagnosis Date  Anxiety  GERD (gastroesophageal reflux disease)  Hyperlipidemia  Hypertension   There is no problem list on file for this patient.  History reviewed. No pertinent surgical history.   No Known Allergies  No current outpatient medications on file prior to visit.   No current facility-administered medications on file prior to visit.   History reviewed. No pertinent family history.   Social History   Tobacco Use  Smoking Status Never  Smokeless Tobacco Never    Social History   Socioeconomic History  Marital status: Widowed  Tobacco Use  Smoking status: Never  Smokeless tobacco: Never  Substance and Sexual Activity  Alcohol use: Yes  Drug use: Never   Objective:   Vitals:  09/25/22 1044  BP: (!) 145/99  Pulse: 82  Temp: 36.9 C (98.4 F)  SpO2: 98%  Weight: 89.5 kg (197 lb 6.4 oz)  Height: 185.4 cm (6\' 1" )  PainSc: 5  PainLoc: Abdomen   Body mass index is 26.04 kg/m.  Physical Exam HENT:  Head: Normocephalic.  Pulmonary:  Effort: Pulmonary effort is normal.  Abdominal:  Tenderness: There is no abdominal tenderness.  Hernia: A hernia is present. Hernia is present in the right inguinal area. There is no hernia in  the left inguinal area.   Comments: MODERATE SIZE RIGHT INGUINAL HERNIA  Musculoskeletal:  General: Normal range of motion.  Skin: General: Skin is warm.  Neurological:  General: No focal deficit present.  Mental Status: He is alert.  Psychiatric:  Mood and Affect: Mood normal.  Behavior: Behavior normal.     Assessment and Plan:   Diagnoses and all orders for this visit:  Right inguinal hernia   Discussed the pathophysiology of inguinal hernias. Recommend repair of his right inguinal hernia with mesh. Pros and cons of open and laparoscopic techniques reviewed. Complications, long-term expectations and quality of life issues reviewed today. He is opted for open repair of his right inguinal hernia with mesh. Risks and benefits reviewed again. Complications of bleeding, infection, numbness, testicle damage, organ injury, blood vessel injury, recurrence, and the need for the treatment standard procedures reviewed as well as anesthesia risk involved.   Hayden Rasmussen, MD

## 2022-10-19 NOTE — H&P (Signed)
Walter Griffin is an 52 y.o. male.   Chief Complaint: right inguinal hernia HPI: pt presents for repair of right inguinal hernia.It is causing pain.  Past Medical History:  Diagnosis Date   GERD (gastroesophageal reflux disease)    Symptoms are concerning for possible angina. Was taken to Cath Lab. Treated with Protonix after hisCADnoted.   H/O seasonal allergies    PFO (patent foramen ovale) 08/30/2021   Echo with Bubble Study positive for shunt within 3-6 cycles suggesting intra-atrial shunt.   Sinus drainage     Past Surgical History:  Procedure Laterality Date   CARDIAC CATHETERIZATION N/A 09/02/2015   Procedure: Left Heart Cath and Coronary Angiography;  Surgeon: Marykay Lex, MD;  Location: [facility] INVASIVE CV LAB;  Service: Cardiovascular;  ostial LAD 35% stenosis at a bend point. Normal EF. Normal EDP.   CT CTA CORONARY W/CA SCORE W/CM &/OR WO/CM  07/22/2021   Calcium score of 0.  Normal coronary origin with normal right dominance.  Small PFO with left-to-right flow suspected.  Left> right gynecomastia.   TRANSTHORACIC ECHOCARDIOGRAM  08/2021   -Bubble Study: LVEF 55 to 60%.  No RWMA.  GR 1 DD.  Normal RV size and function.  Normal aortic and mitral valves.  Normal RAP.  Agitated saline contrast/bubble study was positive with bubbles/shunting observed within 3-6 cardiac cycles, suggesting intra atrial shunt.  No suggestion of atrial septal aneurysm..    Family History  Problem Relation Age of Onset   Heart attack Paternal Grandfather    Heart attack Paternal Uncle        2 uncles   Hyperlipidemia Father    Hypertension Father    Dementia Mother        died complications of DM, Dementia   Breast cancer Mother    Diabetes Mother    Hyperlipidemia Mother    Social History:  reports that he has never smoked. His smokeless tobacco use includes chew. He reports current alcohol use. He reports that he does not use drugs.  Allergies: No Known Allergies  No medications prior  to admission.    No results found for this or any previous visit (from the past 48 hour(s)). No results found.  Review of Systems  All other systems reviewed and are negative.   Blood pressure (!) 156/98, height 6\' 1"  (1.854 m), weight 93.8 kg. Physical Exam Cardiovascular:     Rate and Rhythm: Normal rate.  Pulmonary:     Effort: Pulmonary effort is normal.  Abdominal:     Hernia: A hernia is present. Hernia is present in the right inguinal area.  Musculoskeletal:     Cervical back: Normal range of motion.  Neurological:     Mental Status: He is alert.      Assessment/Plan Right inguinal hernia Plan repair right inguinal hernia with mesh The risk of hernia repair include bleeding,  Infection,   Recurrence of the hernia,  Mesh use, chronic pain,  Organ injury,  Bowel injury,  Bladder injury,   nerve injury with numbness around the incision,  Death,  and worsening of preexisting  medical problems.  The alternatives to surgery have been discussed as well..  Long term expectations of both operative and non operative treatments have been discussed.   The patient agrees to proceed.   Dortha Schwalbe, MD 10/19/2022, 5:46 AM

## 2022-10-19 NOTE — Interval H&P Note (Signed)
History and Physical Interval Note:  10/19/2022 7:21 AM  Walter Griffin  has presented today for surgery, with the diagnosis of RIGHT INGUINAL HERNIA.  The various methods of treatment have been discussed with the patient and family. After consideration of risks, benefits and other options for treatment, the patient has consented to  Procedure(s): INGUINAL HERNIA REPAIR (Right) INSERTION OF MESH (Right) as a surgical intervention.  The patient's history has been reviewed, patient examined, no change in status, stable for surgery.  I have reviewed the patient's chart and labs.  Questions were answered to the patient's satisfaction.     Romona Murdy A Juliahna Wiswell

## 2022-10-19 NOTE — Interval H&P Note (Signed)
History and Physical Interval Note:  10/19/2022 7:21 AM  Walter Griffin  has presented today for surgery, with the diagnosis of RIGHT INGUINAL HERNIA.  The various methods of treatment have been discussed with the patient and family. After consideration of risks, benefits and other options for treatment, the patient has consented to  Procedure(s): INGUINAL HERNIA REPAIR (Right) INSERTION OF MESH (Right) as a surgical intervention.  The patient's history has been reviewed, patient examined, no change in status, stable for surgery.  I have reviewed the patient's chart and labs.  Questions were answered to the patient's satisfaction.     Travonte Byard A Journey Ratterman   

## 2022-10-19 NOTE — Discharge Instructions (Addendum)
[facility] _____[facility]  UMBILICAL OR INGUINAL HERNIA REPAIR: POST OP INSTRUCTIONS  Always review your discharge instruction sheet given to you by the facility where your surgery was performed. IF YOU HAVE DISABILITY OR FAMILY LEAVE FORMS, YOU MUST BRING THEM TO THE OFFICE FOR PROCESSING.   DO NOT GIVE THEM TO YOUR DOCTOR.  1. A  prescription for pain medication may be given to you upon discharge.  Take your pain medication as prescribed, if needed.  If narcotic pain medicine is not needed, then you may take acetaminophen (Tylenol) or ibuprofen (Advil) as needed. 2. Take your usually prescribed medications unless otherwise directed. If you need a refill on your pain medication, please contact your pharmacy.  They will contact our office to request authorization. Prescriptions will not be filled after 5 pm or on week-ends. 3. You should follow a light diet the first 24 hours after arrival home, such as soup and crackers, etc.  Be sure to include lots of fluids daily.  Resume your normal diet the day after surgery. 4.Most patients will experience some swelling and bruising around the umbilicus or in the groin and scrotum.  Ice packs and reclining will help.  Swelling and bruising can take several days to resolve.  6. It is common to experience some constipation if taking pain medication after surgery.  Increasing fluid intake and taking a stool softener (such as Colace) will usually help or prevent this problem from occurring.  A mild laxative (Milk of Magnesia or Miralax) should be taken according to package directions if there are no bowel movements after 48 hours. 7. Unless discharge instructions indicate otherwise, you may remove your bandages 24-48 hours after surgery, and you may shower at that time.  You may have steri-strips (small skin tapes) in place directly over the incision.  These strips should be left on the skin for 7-10 days.  If your surgeon used skin glue on the  incision, you may shower in 24 hours.  The glue will flake off over the next 2-3 weeks.  Any sutures or staples will be removed at the office during your follow-up visit. 8. ACTIVITIES:  You may resume regular (light) daily activities beginning the next day--such as daily self-care, walking, climbing stairs--gradually increasing activities as tolerated.  You may have sexual intercourse when it is comfortable.  Refrain from any heavy lifting or straining until approved by your doctor.  a.You may drive when you are no longer taking prescription pain medication, you can comfortably wear a seatbelt, and you can safely maneuver your car and apply brakes. b.RETURN TO WORK:   _____________________________________________  9.You should see your doctor in the office for a follow-up appointment approximately 2-3 weeks after your surgery.  Make sure that you call for this appointment within a day or two after you arrive home to insure a convenient appointment time. 10.OTHER INSTRUCTIONS: _________________________    _____________________________________  WHEN TO CALL YOUR DOCTOR: Fever over 101.0 Inability to urinate Nausea and/or vomiting Extreme swelling or bruising Continued bleeding from incision. Increased pain, redness, or drainage from the incision  The clinic staff is available to answer your questions during regular business hours.  Please don't hesitate to call and ask to speak to one of the nurses for clinical concerns.  If you have a medical emergency, go to the nearest emergency room or call 911.  A surgeon from [facility] is always on call at the hospital   [address],  [place], [place]  [address] ?  [address] [address], [place], [place]   [address] [phone] ? [phone] ? FAX [phone] Web site: [email or web address]    No Tylenol until 1:45pm   Post Anesthesia Home Care Instructions  Activity: Get plenty of rest for the remainder of  the day. A responsible individual must stay with you for 24 hours following the procedure.  For the next 24 hours, DO NOT: -Drive a car -Advertising copywriter -Drink alcoholic beverages -Take any medication unless instructed by your physician -Make any legal decisions or sign important papers.  Meals: Start with liquid foods such as gelatin or soup. Progress to regular foods as tolerated. Avoid greasy, spicy, heavy foods. If nausea and/or vomiting occur, drink only clear liquids until the nausea and/or vomiting subsides. Call your physician if vomiting continues.  Special Instructions/Symptoms: Your throat may feel dry or sore from the anesthesia or the breathing tube placed in your throat during surgery. If this causes discomfort, gargle with warm salt water. The discomfort should disappear within 24 hours.  If you had a scopolamine patch placed behind your ear for the management of post- operative nausea and/or vomiting:  1. The medication in the patch is effective for 72 hours, after which it should be removed.  Wrap patch in a tissue and discard in the trash. Wash hands thoroughly with soap and water. 2. You may remove the patch earlier than 72 hours if you experience unpleasant side effects which may include dry mouth, dizziness or visual disturbances. 3. Avoid touching the patch. Wash your hands with soap and water after contact with the patch.

## 2022-10-19 NOTE — Anesthesia Preprocedure Evaluation (Signed)
Anesthesia Evaluation  Patient identified by MRN, date of birth, ID band  Reviewed: Allergy & Precautions, NPO status , Patient's Chart, lab work & pertinent test results  History of Anesthesia Complications Negative for: history of anesthetic complications  Airway Mallampati: IV  TM Distance: >3 FB Neck ROM: Full    Dental  (+) Poor Dentition, Dental Advisory Given   Pulmonary shortness of breath and with exertion   breath sounds clear to auscultation       Cardiovascular hypertension, + CAD   Rhythm:Regular   CARDIAC CATHETERIZATION N/A 09/02/2015    Procedure: Left Heart Cath and Coronary Angiography;  Surgeon: Marykay Lex, MD;  Location: [facility] INVASIVE CV LAB;  Service: Cardiovascular;  ostial LAD 35% stenosis at a bend point. Normal EF. Normal EDP.  CT CTA CORONARY W/CA SCORE W/CM &/OR WO/CM   07/22/2021   Calcium score of 0.  Normal coronary origin with normal right dominance.  Small PFO with left-to-right flow suspected.  Left> right gynecomastia.  TRANSTHORACIC ECHOCARDIOGRAM   08/2021   -Bubble Study: LVEF 55 to 60%.  No RWMA.  GR 1 DD.  Normal RV size and function.  Normal aortic and mitral valves.  Normal RAP.  Agitated saline contrast/bubble study was positive with bubbles/shunting observed within 3-6 cardiac cycles, suggesting intra atrial shunt.  No suggestion of atrial septal aneurysm.Marland Kitchen        Neuro/Psych negative neurological ROS  negative psych ROS   GI/Hepatic Neg liver ROS,GERD  Controlled,,  Endo/Other  negative endocrine ROS    Renal/GU negative Renal ROS     Musculoskeletal negative musculoskeletal ROS (+)    Abdominal   Peds  Hematology negative hematology ROS (+)   Anesthesia Other Findings   Reproductive/Obstetrics                             Anesthesia Physical Anesthesia Plan  ASA: 2  Anesthesia Plan: General and Regional   Post-op Pain Management:  Regional block*, Ofirmev IV (intra-op)* and Toradol IV (intra-op)*   Induction: Intravenous  PONV Risk Score and Plan: 2 and Ondansetron and Dexamethasone  Airway Management Planned: LMA and Oral ETT  Additional Equipment: None  Intra-op Plan:   Post-operative Plan: Extubation in OR  Informed Consent: I have reviewed the patients History and Physical, chart, labs and discussed the procedure including the risks, benefits and alternatives for the proposed anesthesia with the patient or authorized representative who has indicated his/her understanding and acceptance.     Dental advisory given  Plan Discussed with: CRNA  Anesthesia Plan Comments:        Anesthesia Quick Evaluation

## 2022-10-19 NOTE — Transfer of Care (Signed)
Immediate Anesthesia Transfer of Care Note  Patient: Walter Griffin  Procedure(s) Performed: INGUINAL HERNIA REPAIR (Right: Groin) INSERTION OF MESH (Right: Groin)  Patient Location: PACU  Anesthesia Type:General  Level of Consciousness: awake, alert , and oriented  Airway & Oxygen Therapy: Patient Spontanous Breathing and Patient connected to face mask oxygen  Post-op Assessment: Report given to RN and Post -op Vital signs reviewed and stable  Post vital signs: Reviewed and stable  Last Vitals:  Vitals Value Taken Time  BP 136/78 10/19/22 0847  Temp 36.2 C 10/19/22 0847  Pulse 99 10/19/22 0848  Resp 30 10/19/22 0848  SpO2 94 % 10/19/22 0848  Vitals shown include unvalidated device data.  Last Pain:  Vitals:   10/19/22 0627  TempSrc: Temporal  PainSc: 6       Patients Stated Pain Goal: 3 (10/19/22 1610)  Complications: No notable events documented.

## 2022-10-19 NOTE — Anesthesia Procedure Notes (Signed)
Anesthesia Regional Block: TAP block   Pre-Anesthetic Checklist: , timeout performed,  Correct Patient, Correct Site, Correct Laterality,  Correct Procedure, Correct Position, site marked,  Risks and benefits discussed,  Surgical consent,  Pre-op evaluation,  At surgeon's request and post-op pain management  Laterality: Right  Prep: chloraprep       Needles:  Injection technique: Single-shot      Needle Length: 9cm  Needle Gauge: 22     Additional Needles: Arrow StimuQuik ECHO Echogenic Stimulating PNB Needle  Procedures:,,,, ultrasound used (permanent image in chart),,    Narrative:  Start time: 10/19/2022 7:31 AM End time: 10/19/2022 7:36 AM Injection made incrementally with aspirations every 5 mL.  Performed by: Personally  Anesthesiologist: Val Eagle, MD

## 2022-10-19 NOTE — Op Note (Signed)
Preoperative diagnosis: Right inguinal hernia indirect initial reducible  Postoperative diagnosis: Same  Procedure: Repair of right inguinal hernia with ultra Pro hernia system mesh  Surgeon: Harriette Bouillon, MD  Anesthesia: General with transversus abdominis plane block and 0.25% Marcaine plain local  EBL: Minimal  Specimen: None  Drains: None  Indications for procedure: The patient is a 52 year old male with a large right inguinal hernia.  He presents today for repair after being seen in the office and evaluating all of his treatment options as well as surgical options.  We discussed the use of mesh as well.The risk of hernia repair include bleeding,  Infection,   Recurrence of the hernia,  Mesh use, chronic pain,  Organ injury,  Bowel injury,  Bladder injury,   nerve injury with numbness around the incision,  Death,  and worsening of preexisting  medical problems.  The alternatives to surgery have been discussed as well..  Long term expectations of both operative and non operative treatments have been discussed.   The patient agrees to proceed.   Description of procedure: The patient was met in the holding area and questions were answered.  The right side was marked as the correct site.  He underwent a block per anesthesia protocol.  He was then taken back to the operating room.  He is placed supine upon the operating room table.  After induction of general anesthesia, the right inguinal region was prepped and draped in a sterile fashion and timeout performed.  Local anesthetic was infiltrated along the right inguinal canal skin overlying region.  Incision was made.  Dissection was carried down through Scarpa's fascia into the aponeurosis of the external bleak was identified.  The fibers were opened using a scalpel and Metzenbaum scissors through the external ring.  He had a large indirect hernia.  The cord structures were encircled with quarter inch Penrose drain.  The hernia sac was dissected  off the cord structures and reduced back into the preperitoneal space.  A large ultra Pro hernia system was used with the inner leaflet placed into the preperitoneal space and the onlay placed onto the floor the inguinal canal.  A slit was cut for the cord structures.  The mesh was secured to the shelving edge of the inguinal ligament, pubic tubercle and internal oblique medially with 0 Novafil suture.  The slit was closed carefully around the cord structures without impingement with 0 Novafil.  There is no undue tension on the repair.  There is ample room for the cord exit the mesh.The ilioinguinal nerve was divided as it exited the mesh to prevent mesh entrapment and pain.  Hemostasis was excellent.  The aponeurosis of the external bleak was closed with 2-0 Vicryl.  3-0 Vicryl was used to approximate Scarpa's fascia and 4 Monocryl was used to close the skin in a subcuticular fashion.  Dermabond was applied.  All counts were found to be correct.  The patient was awoke extubated taken to recovery in satisfactory condition.

## 2022-10-19 NOTE — Anesthesia Postprocedure Evaluation (Signed)
Anesthesia Post Note  Patient: Walter Griffin  Procedure(s) Performed: INGUINAL HERNIA REPAIR (Right: Groin) INSERTION OF MESH (Right: Groin)     Patient location during evaluation: PACU Anesthesia Type: Regional and General Level of consciousness: awake and alert Pain management: pain level controlled Vital Signs Assessment: post-procedure vital signs reviewed and stable Respiratory status: spontaneous breathing, nonlabored ventilation and respiratory function stable Cardiovascular status: blood pressure returned to baseline and stable Postop Assessment: no apparent nausea or vomiting Anesthetic complications: no   No notable events documented.  Last Vitals:  Vitals:   10/19/22 0915 10/19/22 0936  BP: (!) 149/97 (!) 150/99  Pulse: 80 88  Resp: 12 16  Temp:  (!) 36.2 C  SpO2: 93% 93%    Last Pain:  Vitals:   10/19/22 0934  TempSrc:   PainSc: 3                  Mckinzi Eriksen

## 2022-10-20 ENCOUNTER — Encounter (HOSPITAL_BASED_OUTPATIENT_CLINIC_OR_DEPARTMENT_OTHER): Payer: Self-pay | Admitting: Surgery

## 2022-11-15 DIAGNOSIS — H52223 Regular astigmatism, bilateral: Secondary | ICD-10-CM | POA: Diagnosis not present

## 2023-06-26 IMAGING — CT CT HEART MORP W/ CTA COR W/ SCORE W/ CA W/CM &/OR W/O CM
1 series · 3 of 5 positions shown, 4 images · IV contrast (omnipaque)
Comparison: 07/30/2013 chest radiograph
COMPARISON: 07/30/2013 chest radiograph

Addendum:
EXAM:
OVER-READ INTERPRETATION  CT CHEST

The following report is an over-read performed by radiologist Dr.
Zuleika Brent [[email or web address]] on 07/22/2021. This over-read
does not include interpretation of cardiac or coronary anatomy or
pathology. The coronary CTA interpretation by the cardiologist is
attached.
HISTORY: 50 yo male with chest pain, nonspecific
Cardiac/Coronary CTA
TECHNIQUE: The patient was scanned on a Siemens Force scanner.
PROTOCOL: A 110 kV prospective scan was triggered in the descending thoracic
aorta at 111 HU's. Axial non-contrast 3 mm slices were carried out
through the heart. The data set was analyzed on a dedicated work
station and scored using the Agatson method. Gantry rotation speed
was 250 msecs and collimation was .6 mm. Beta blockade and 0.8 mg of
sl NTG was given. The 3D data set was reconstructed in 5% intervals
of the 35-75 % of the R-R cycle. Diastolic phases were analyzed on a
dedicated work station using MPR, MIP and VRT modes. The patient
received 95mL OMNIPAQUE IOHEXOL 350 MG/ML SOLN of contrast.

[Series 2365: coronaries · 0.71mm/px · 3 of 5 slices shown, 4 images]
[im 2/5  vessel]
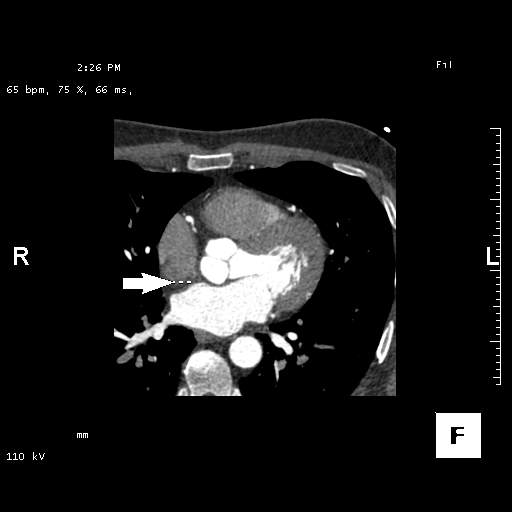
[im 2/5  lung]
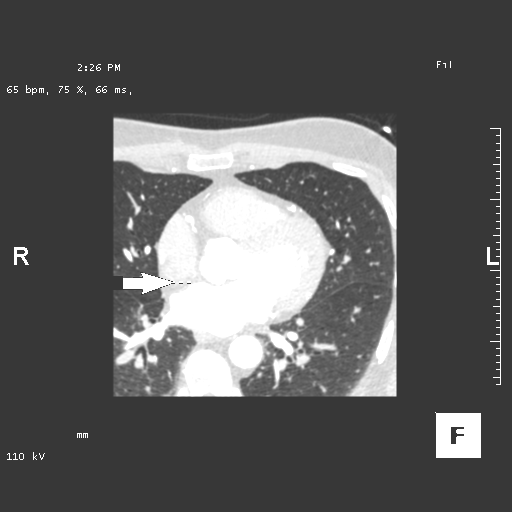
[im 3/5  vessel]
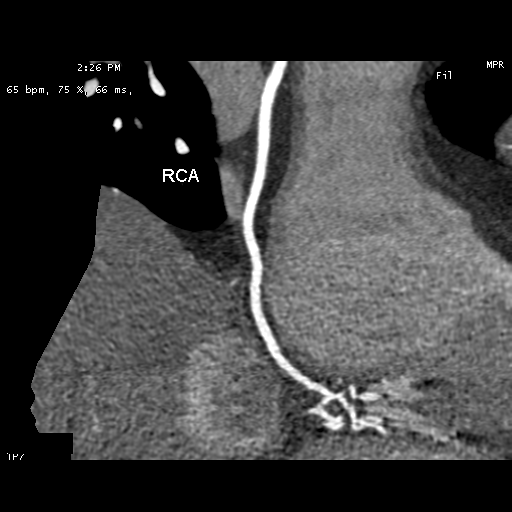
[im 4/5  vessel]
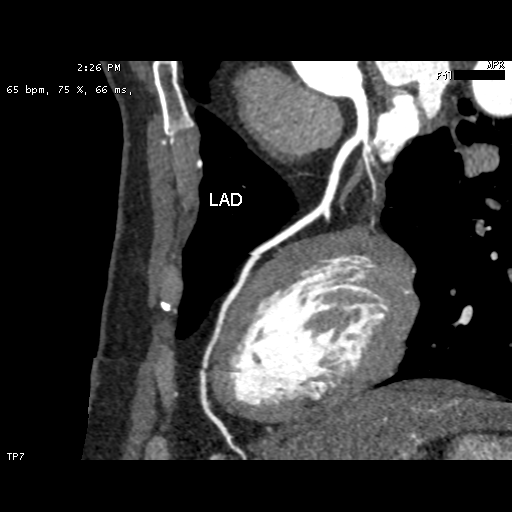

[3 of 5 positions shown; findings below may reference images not displayed]

FINDINGS: Vascular: Normal aortic caliber.

Mediastinum/Nodes: No central pulmonary embolism, on this
non-dedicated study.

Lungs/Pleura: No imaged thoracic adenopathy.

Upper Abdomen: Variant lateral segment left liver lobe extending
into the left upper quadrant. Segment 2 well-circumscribed
hypoattenuating 1.3 cm lesion is favored to represent a cyst. Mild
hepatic steatosis.

Musculoskeletal: Moderate left and mild right-sided gynecomastia.
IMPRESSION: No acute findings in the imaged extracardiac chest.

Hepatic steatosis.

Left-greater-than-right gynecomastia.
FINDINGS: Quality: Good, HR 65

Coronary calcium score: The patient's coronary artery calcium score
is 0, which places the patient in the 0 percentile.

Coronary arteries: Normal coronary origins.  Right dominance.

Right Coronary Artery: Dominant.  No disease.

Left Main Coronary Artery: Normal. Bifurcates into the LAD and LCx
arteries.

Left Anterior Descending Coronary Artery: Large anterior vessel
which coarses to the apex. No disease.

Left Circumflex Artery: AV groove vessel - no disease.

Aorta: Normal size, 27 mm at the mid ascending aorta (level of the
PA bifurcation) measured double oblique. No calcifications. No
dissection.

Aortic Valve: Trileaflet. No calcifications.

Other findings:

Normal pulmonary vein drainage into the left atrium.

Normal left atrial appendage without a thrombus.

Normal size of the pulmonary artery.

Small PFO with left to right flow suspected
IMPRESSION: 1. No evidence of CAD, CADRADS = 0.

2. Coronary calcium score of 0. This was 0 percentile for age and
sex matched control.

3. Normal coronary origin with right dominance.

4. Small PFO with left to right flow suspected

5. Consider non-coronary causes of chest pain.

*** End of Addendum ***
EXAM:
OVER-READ INTERPRETATION  CT CHEST

The following report is an over-read performed by radiologist Dr.
Zuleika Brent [[email or web address]] on 07/22/2021. This over-read
does not include interpretation of cardiac or coronary anatomy or
pathology. The coronary CTA interpretation by the cardiologist is
attached.
FINDINGS: Vascular: Normal aortic caliber.

Mediastinum/Nodes: No central pulmonary embolism, on this
non-dedicated study.

Lungs/Pleura: No imaged thoracic adenopathy.

Upper Abdomen: Variant lateral segment left liver lobe extending
into the left upper quadrant. Segment 2 well-circumscribed
hypoattenuating 1.3 cm lesion is favored to represent a cyst. Mild
hepatic steatosis.

Musculoskeletal: Moderate left and mild right-sided gynecomastia.
IMPRESSION: No acute findings in the imaged extracardiac chest.

Hepatic steatosis.

Left-greater-than-right gynecomastia.

## 2023-06-29 DIAGNOSIS — R5383 Other fatigue: Secondary | ICD-10-CM | POA: Diagnosis not present

## 2023-06-29 DIAGNOSIS — J189 Pneumonia, unspecified organism: Secondary | ICD-10-CM | POA: Diagnosis not present

## 2023-06-29 DIAGNOSIS — R059 Cough, unspecified: Secondary | ICD-10-CM | POA: Diagnosis not present

## 2023-06-29 DIAGNOSIS — R52 Pain, unspecified: Secondary | ICD-10-CM | POA: Diagnosis not present

## 2023-06-29 DIAGNOSIS — Z03818 Encounter for observation for suspected exposure to other biological agents ruled out: Secondary | ICD-10-CM | POA: Diagnosis not present

## 2023-07-03 DIAGNOSIS — J189 Pneumonia, unspecified organism: Secondary | ICD-10-CM | POA: Diagnosis not present

## 2023-12-19 DIAGNOSIS — H01001 Unspecified blepharitis right upper eyelid: Secondary | ICD-10-CM | POA: Diagnosis not present

## 2023-12-20 DIAGNOSIS — H02401 Unspecified ptosis of right eyelid: Secondary | ICD-10-CM | POA: Diagnosis not present

## 2023-12-21 DIAGNOSIS — H53001 Unspecified amblyopia, right eye: Secondary | ICD-10-CM | POA: Diagnosis not present

## 2023-12-21 DIAGNOSIS — H538 Other visual disturbances: Secondary | ICD-10-CM | POA: Diagnosis not present

## 2023-12-21 DIAGNOSIS — H5340 Unspecified visual field defects: Secondary | ICD-10-CM | POA: Diagnosis not present

## 2024-01-09 DIAGNOSIS — H5340 Unspecified visual field defects: Secondary | ICD-10-CM | POA: Diagnosis not present

## 2024-01-09 DIAGNOSIS — G902 Horner's syndrome: Secondary | ICD-10-CM | POA: Diagnosis not present

## 2024-01-09 DIAGNOSIS — H53001 Unspecified amblyopia, right eye: Secondary | ICD-10-CM | POA: Diagnosis not present

## 2024-01-09 DIAGNOSIS — H538 Other visual disturbances: Secondary | ICD-10-CM | POA: Diagnosis not present
